# Patient Record
Sex: Female | Born: 1970 | Race: Black or African American | Hispanic: No | Marital: Single | Smoking: Current every day smoker
Health system: Southern US, Community
[De-identification: ages and names within clinical notes are randomized; demographics above are authoritative.]

## PROBLEM LIST (undated history)

## (undated) DIAGNOSIS — Z789 Other specified health status: Secondary | ICD-10-CM

## (undated) DIAGNOSIS — D649 Anemia, unspecified: Secondary | ICD-10-CM

## (undated) DIAGNOSIS — I10 Essential (primary) hypertension: Secondary | ICD-10-CM

## (undated) DIAGNOSIS — D219 Benign neoplasm of connective and other soft tissue, unspecified: Secondary | ICD-10-CM

## (undated) HISTORY — DX: Other specified health status: Z78.9

## (undated) HISTORY — PX: WISDOM TOOTH EXTRACTION: SHX21

## (undated) HISTORY — PX: MYOMECTOMY: SHX85

## (undated) HISTORY — DX: Benign neoplasm of connective and other soft tissue, unspecified: D21.9

## (undated) HISTORY — DX: Anemia, unspecified: D64.9

---

## 1999-07-02 ENCOUNTER — Emergency Department (HOSPITAL_COMMUNITY): Admission: EM | Admit: 1999-07-02 | Discharge: 1999-07-02 | Payer: Self-pay | Admitting: Emergency Medicine

## 2001-12-09 ENCOUNTER — Encounter: Admission: RE | Admit: 2001-12-09 | Discharge: 2001-12-09 | Payer: Self-pay | Admitting: Internal Medicine

## 2001-12-09 ENCOUNTER — Encounter: Payer: Self-pay | Admitting: Internal Medicine

## 2004-08-16 ENCOUNTER — Other Ambulatory Visit: Admission: RE | Admit: 2004-08-16 | Discharge: 2004-08-16 | Payer: Self-pay | Admitting: Obstetrics and Gynecology

## 2004-08-22 ENCOUNTER — Encounter: Admission: RE | Admit: 2004-08-22 | Discharge: 2004-08-22 | Payer: Self-pay | Admitting: Obstetrics & Gynecology

## 2004-09-27 ENCOUNTER — Ambulatory Visit (HOSPITAL_COMMUNITY): Admission: RE | Admit: 2004-09-27 | Discharge: 2004-09-27 | Payer: Self-pay | Admitting: Obstetrics & Gynecology

## 2009-03-03 HISTORY — PX: MYOMECTOMY: SHX85

## 2012-11-08 ENCOUNTER — Ambulatory Visit: Payer: Self-pay | Admitting: Internal Medicine

## 2012-11-08 VITALS — BP 130/80 | HR 74 | Temp 99.2°F | Resp 16 | Ht 68.0 in | Wt 161.0 lb

## 2012-11-08 DIAGNOSIS — N898 Other specified noninflammatory disorders of vagina: Secondary | ICD-10-CM

## 2012-11-08 DIAGNOSIS — Z2089 Contact with and (suspected) exposure to other communicable diseases: Secondary | ICD-10-CM

## 2012-11-08 DIAGNOSIS — Z202 Contact with and (suspected) exposure to infections with a predominantly sexual mode of transmission: Secondary | ICD-10-CM

## 2012-11-08 DIAGNOSIS — A599 Trichomoniasis, unspecified: Secondary | ICD-10-CM

## 2012-11-08 LAB — POCT WET PREP WITH KOH
KOH Prep POC: NEGATIVE
RBC Wet Prep HPF POC: NEGATIVE
Trichomonas, UA: POSITIVE
Yeast Wet Prep HPF POC: NEGATIVE

## 2012-11-08 MED ORDER — METRONIDAZOLE 500 MG PO TABS: ORAL_TABLET | ORAL | Status: DC

## 2012-11-08 NOTE — Progress Notes (Signed)
  Subjective:    Patient ID: Peggy Payne, female    DOB: 12-03-1970, 42 y.o.   MRN: [id number]  HPI 42 yo female presents with 2 week history of clear, odorous vaginal discharge. Minimal itching.  No burning, frequency, abdominal pain. Has occasional back pain associated with menses, relieved with Advil.  No fever.  Has 1 sex partner long term/not interested in STI screening at this time.   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 Does not want pap at this time due to cost. Has not had abnormal Pap in past.   Review of Systems     Objective:   Physical Exam Well developed female in NAD.  External genitalia normal, cervix with moderate amount thin, white discharge. No CMT, friability. Samples obtained with swabs.  Results for orders placed in visit on 11/08/12  POCT WET PREP WITH KOH      Result Value Range   Trichomonas, UA Positive     Clue Cells Wet Prep HPF POC 1-6     Epithelial Wet Prep HPF POC 0-3     Yeast Wet Prep HPF POC neg     Bacteria Wet Prep HPF POC 2+     RBC Wet Prep HPF POC neg     WBC Wet Prep HPF POC 2-8     KOH Prep POC Negative         Assessment & Plan:  Vaginal discharge - Plan: POCT Wet Prep with KOH  Trichimoniasis  Exposure to STD - Plan: GC/Chlamydia Probe Amp  Discussed diagnosis with patient and recommended testing for GC/chlymadia- patient agreed. Prescription provided for patient and her partner. Rec testing for partner.  Meds ordered this encounter  Medications  . metroNIDAZOLE (FLAGYL) 500 MG tablet    Sig: 4 tabs as single dose    Dispense:  8 tablet    Refill:  0   I have reviewed and agree with documentation. I participated fully in E/P. Robert P. Merla Riches, M.D.

## 2012-11-10 LAB — GC/CHLAMYDIA PROBE AMP
CT Probe RNA: NEGATIVE
GC Probe RNA: NEGATIVE

## 2012-12-20 ENCOUNTER — Ambulatory Visit: Payer: Self-pay | Admitting: Family Medicine

## 2012-12-20 VITALS — BP 132/84 | HR 89 | Temp 98.1°F | Resp 18 | Ht 66.0 in | Wt 164.0 lb

## 2012-12-20 DIAGNOSIS — N898 Other specified noninflammatory disorders of vagina: Secondary | ICD-10-CM

## 2012-12-20 DIAGNOSIS — A599 Trichomoniasis, unspecified: Secondary | ICD-10-CM

## 2012-12-20 LAB — POCT WET PREP WITH KOH
KOH Prep POC: NEGATIVE
Trichomonas, UA: POSITIVE
Yeast Wet Prep HPF POC: NEGATIVE

## 2012-12-20 MED ORDER — METRONIDAZOLE 500 MG PO TABS: 500.0000 mg | ORAL_TABLET | Freq: Three times a day (TID) | ORAL | Status: DC

## 2012-12-20 NOTE — Progress Notes (Signed)
  Subjective:  This chart was scribed for Elvina Sidle, MD by Quintella Reichert, ED scribe.  This patient was seen in room [facility] Room 14 and the patient's care was started at 4:18 PM.   Patient ID: Peggy Payne, female    DOB: [birth year], 42 y.o.   MRN: [id number]  Chief Complaint  Patient presents with  . 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 Her other STD tests were negative. She was treated with single dose of Flagyl as was her partner.  She never completely resolved her problem. She had some itching and took some yeast medicine but that really didn't make any difference either. He watery yellow-white discharge continues.   History reviewed. No pertinent past medical history.    Medication List       This list is accurate as of: 12/20/12  4:22 PM.  Always use your most recent med list.               metroNIDAZOLE 500 MG tablet  Commonly known as:  FLAGYL  4 tabs as single dose        No Known Allergies   Review of Systems     Objective:   Physical Exam  Nursing note and vitals reviewed.  Pelvic exam reveals a creamy white watery discharge. There is no bleeding or friability. Cervix is closed and nulliparous. There is no cervical motion tenderness. Results for orders placed in visit on 12/20/12  POCT WET PREP WITH KOH      Result Value Range   Trichomonas, UA Positive     Clue Cells Wet Prep HPF POC 0-4     Epithelial Wet Prep HPF POC 6-21     Yeast Wet Prep HPF POC neg     Bacteria Wet Prep HPF POC 3+     RBC Wet Prep HPF POC 0-3     WBC Wet Prep HPF POC 6-18     KOH Prep POC Negative       BP 132/84  Pulse 89  Temp(Src) 98.1 F (36.7 C)  Resp 18  Ht 5\' 6"  (1.676 m)  Wt 164 lb (74.39 kg)  BMI 26.48 kg/m2  SpO2 99%  LMP 12/16/2012    Assessment & Plan:   Vaginal discharge -  Plan: POCT Wet Prep with KOH, metroNIDAZOLE (FLAGYL) 500 MG tablet  Trichomonas - Plan: metroNIDAZOLE (FLAGYL) 500 MG tablet  Signed, Elvina Sidle, MD

## 2012-12-20 NOTE — Patient Instructions (Signed)
Trichomoniasis °Trichomoniasis is an infection, caused by the Trichomonas organism, that affects both women and men. In women, the outer female genitalia and the vagina are affected. In men, the penis is mainly affected, but the prostate and other reproductive organs can also be involved. Trichomoniasis is a sexually transmitted disease (STD) and is most often passed to another person through sexual contact. The majority of people who get trichomoniasis do so from a sexual encounter and are also at risk for other STDs. °CAUSES  °· Sexual intercourse with an infected partner. °· It can be present in swimming pools or hot tubs. °SYMPTOMS  °· Abnormal gray-green frothy vaginal discharge in women. °· Vaginal itching and irritation in women. °· Itching and irritation of the area outside the vagina in women. °· Penile discharge with or without pain in males. °· Inflammation of the urethra (urethritis), causing painful urination. °· Bleeding after sexual intercourse. °RELATED COMPLICATIONS °· Pelvic inflammatory disease. °· Infection of the uterus (endometritis). °· Infertility. °· Tubal (ectopic) pregnancy. °· It can be associated with other STDs, including gonorrhea and chlamydia, hepatitis B, and HIV. °COMPLICATIONS DURING PREGNANCY °· Early (premature) delivery. °· Premature rupture of the membranes (PROM). °· Low birth weight. °DIAGNOSIS  °· Visualization of Trichomonas under the microscope from the vagina discharge. °· Ph of the vagina greater than 4.5, tested with a test tape. °· Trich Rapid Test. °· Culture of the organism, but this is not usually needed. °· It may be found on a Pap test. °· Having a "strawberry cervix,"which means the cervix looks very red like a strawberry. °TREATMENT  °· You may be given medication to fight the infection. Inform your caregiver if you could be or are pregnant. Some medications used to treat the infection should not be taken during pregnancy. °· Over-the-counter medications or  creams to decrease itching or irritation may be recommended. °· Your sexual partner will need to be treated if infected. °HOME CARE INSTRUCTIONS  °· Take all medication prescribed by your caregiver. °· Take over-the-counter medication for itching or irritation as directed by your caregiver. °· Do not have sexual intercourse while you have the infection. °· Do not douche or wear tampons. °· Discuss your infection with your partner, as your partner may have acquired the infection from you. Or, your partner may have been the person who transmitted the infection to you. °· Have your sex partner examined and treated if necessary. °· Practice safe, informed, and protected sex. °· See your caregiver for other STD testing. °SEEK MEDICAL CARE IF:  °· You still have symptoms after you finish the medication. °· You have an oral temperature above 102° F (38.9° C). °· You develop belly (abdominal) pain. °· You have pain when you urinate. °· You have bleeding after sexual intercourse. °· You develop a rash. °· The medication makes you sick or makes you throw up (vomit). °Document Released: 08/13/2000 Document Revised: 05/12/2011 Document Reviewed: 09/08/2008 °ExitCare® Patient Information ©2014 [employer]. ° °

## 2020-08-22 ENCOUNTER — Encounter (HOSPITAL_COMMUNITY): Payer: Self-pay | Admitting: *Deleted

## 2020-08-22 ENCOUNTER — Other Ambulatory Visit: Payer: Self-pay

## 2020-08-22 ENCOUNTER — Emergency Department (HOSPITAL_COMMUNITY): Payer: 59

## 2020-08-22 ENCOUNTER — Emergency Department (HOSPITAL_COMMUNITY)
Admission: EM | Admit: 2020-08-22 | Discharge: 2020-08-23 | Disposition: A | Discharge status: Home / Self Care | Payer: 59 | Attending: Emergency Medicine | Admitting: Emergency Medicine

## 2020-08-22 DIAGNOSIS — D251 Intramural leiomyoma of uterus: Secondary | ICD-10-CM | POA: Insufficient documentation

## 2020-08-22 DIAGNOSIS — F17200 Nicotine dependence, unspecified, uncomplicated: Secondary | ICD-10-CM | POA: Insufficient documentation

## 2020-08-22 DIAGNOSIS — R109 Unspecified abdominal pain: Secondary | ICD-10-CM | POA: Diagnosis present

## 2020-08-22 DIAGNOSIS — D649 Anemia, unspecified: Secondary | ICD-10-CM | POA: Insufficient documentation

## 2020-08-22 DIAGNOSIS — N838 Other noninflammatory disorders of ovary, fallopian tube and broad ligament: Secondary | ICD-10-CM | POA: Insufficient documentation

## 2020-08-22 LAB — URINALYSIS, ROUTINE W REFLEX MICROSCOPIC
Bilirubin Urine: NEGATIVE
Glucose, UA: NEGATIVE mg/dL
Hgb urine dipstick: NEGATIVE
Ketones, ur: NEGATIVE mg/dL
Leukocytes,Ua: NEGATIVE
Nitrite: NEGATIVE
Protein, ur: NEGATIVE mg/dL
Specific Gravity, Urine: 1.006 (ref 1.005–1.030)
pH: 5 (ref 5.0–8.0)

## 2020-08-22 LAB — COMPREHENSIVE METABOLIC PANEL
ALT: 23 U/L (ref 0–44)
AST: 30 U/L (ref 15–41)
Albumin: 5 g/dL (ref 3.5–5.0)
Alkaline Phosphatase: 59 U/L (ref 38–126)
Anion gap: 6 (ref 5–15)
BUN: 15 mg/dL (ref 6–20)
CO2: 26 mmol/L (ref 22–32)
Calcium: 9.6 mg/dL (ref 8.9–10.3)
Chloride: 104 mmol/L (ref 98–111)
Creatinine, Ser: 0.67 mg/dL (ref 0.44–1.00)
GFR, Estimated: >60 mL/min (ref >60)
Glucose, Bld: 97 mg/dL (ref 70–99)
Potassium: 3.7 mmol/L (ref 3.5–5.1)
Sodium: 136 mmol/L (ref 135–145)
Total Bilirubin: 0.4 mg/dL (ref 0.3–1.2)
Total Protein: 8.5 g/dL — ABNORMAL HIGH (ref 6.5–8.1)

## 2020-08-22 LAB — CBC WITH DIFFERENTIAL/PLATELET
Abs Immature Granulocytes: 0.01 10*3/uL (ref 0.00–0.07)
Basophils Absolute: 0 10*3/uL (ref 0.0–0.1)
Basophils Relative: 1 %
Eosinophils Absolute: 0 10*3/uL (ref 0.0–0.5)
Eosinophils Relative: 0 %
HCT: 30.6 % — ABNORMAL LOW (ref 36.0–46.0)
Hemoglobin: 8.6 g/dL — ABNORMAL LOW (ref 12.0–15.0)
Immature Granulocytes: 0 %
Lymphocytes Relative: 27 %
Lymphs Abs: 1.5 10*3/uL (ref 0.7–4.0)
MCH: 18.6 pg — ABNORMAL LOW (ref 26.0–34.0)
MCHC: 28.1 g/dL — ABNORMAL LOW (ref 30.0–36.0)
MCV: 66.1 fL — ABNORMAL LOW (ref 80.0–100.0)
Monocytes Absolute: 0.8 10*3/uL (ref 0.1–1.0)
Monocytes Relative: 14 %
Neutro Abs: 3.4 10*3/uL (ref 1.7–7.7)
Neutrophils Relative %: 58 %
Platelets: 589 10*3/uL — ABNORMAL HIGH (ref 150–400)
RBC: 4.63 MIL/uL (ref 3.87–5.11)
RDW: 20.4 % — ABNORMAL HIGH (ref 11.5–15.5)
WBC: 5.8 10*3/uL (ref 4.0–10.5)
nRBC: 0 % (ref 0.0–0.2)

## 2020-08-22 LAB — LIPASE, BLOOD: Lipase: 25 U/L (ref 11–51)

## 2020-08-22 MED ORDER — IOHEXOL 300 MG/ML  SOLN
100.0000 mL | Freq: Once | INTRAMUSCULAR | Status: AC | PRN
  Administered 2020-08-22: 100 mL via INTRAVENOUS

## 2020-08-22 MED ORDER — SODIUM CHLORIDE (PF) 0.9 % IJ SOLN
INTRAMUSCULAR | Status: AC
  Filled 2020-08-22: qty 50

## 2020-08-22 MED ORDER — KETOROLAC TROMETHAMINE 30 MG/ML IJ SOLN
30.0000 mg | Freq: Once | INTRAMUSCULAR | Status: AC
  Administered 2020-08-22: 30 mg via INTRAVENOUS
  Filled 2020-08-22: qty 1

## 2020-08-22 NOTE — ED Triage Notes (Signed)
Sent from UC. Pain in right side of abdomen. Hx of uterine fibroids. Reports some discomfort with urination.

## 2020-08-22 NOTE — ED Provider Notes (Signed)
Emergency Medicine Provider Triage Evaluation Note  Peggy Payne 50 y.o. female was evaluated in triage.  Pt complains of abdominal pain.  She reports has been intermittently is primarily in the right side and suprapubic region.  She reports that she went to urgent care walk-in clinic this morning and was noted to have a palpable abdominal mass was sent to the ED for further evaluation.  She reports she has had some discomfort with urination.  No hematuria.  She is also had some nausea.  Denies any fevers, vomiting.  She reports history of uterine fibroids and has had myomectomy about 15 years ago.  She reports she has not had recent follow-up regarding her uterine fibroids.   Review of Systems  Positive: Abdominal pain, nausea, dysuria Negative: Fevers, vomiting.  Physical Exam  BP 134/82   Pulse 70   Temp 98.2 F (36.8 C) (Oral)   Resp 18   Ht 5\' 4"  (1.626 m)   Wt 65.8 kg   SpO2 100%   BMI 24.89 kg/m  Gen:   Awake, no distress   HEENT:  Atraumatic  Resp:  Normal effort  Cardiac:  Normal rate  Abd:   Nondistended, tenderness noted to suprapubic region, right lower quadrant. MSK:   Moves extremities without difficulty  Neuro:  Speech clear   Other:      Medical Decision Making  Medically screening exam initiated at 4:07 PM  Appropriate orders placed.  Peggy Payne was informed that the remainder of the evaluation will be completed by another provider, this initial triage assessment does not replace that evaluation. They are counseled that they will need to remain in the ED until the completion of their workup, including full H&P and results of any tests.  Risks of leaving the emergency department prior to completion of treatment were discussed. Patient was advised to inform ED staff if they are leaving before their treatment is complete. The patient acknowledged these risks and time was allowed for questions.     The patient appears stable so that the remainder of the MSE may be  completed by another provider.    Clinical Impression  Abdominal pain.   Portions of this note were generated with Lobbyist. Dictation errors may occur despite best attempts at proofreading.     Volanda Napoleon, PA-C 08/22/20 1608    Truddie Hidden, MD 08/22/20 [phone]

## 2020-08-22 NOTE — Discharge Instructions (Addendum)
The gynecology clinic should be contacting you in the next couple of days to schedule close follow-up appointment. Is important you follow-up regarding your abnormal imaging findings today.  They can also keep a close eye on your hemoglobin level. Return to the emergency department for heavier than normal vaginal bleeding with associated lightheadedness/severe fatigue or shortness of breath, or any concerning symptoms.

## 2020-08-22 NOTE — ED Provider Notes (Signed)
[facility] DEPT Provider Note   CSN: [id number] Arrival date & time: 08/22/20  1554     History Chief Complaint  Patient presents with   Abdominal Pain    Peggy Payne is a 50 y.o. female past medical history of uterine fibroids, presenting emergency department for evaluation of 1 week of intermittent abdominal pain.  Pain is mostly in the lower abdomen and the right flank.  Pain is worse with movement and walking.  She has some "irritation" with urination though no dysuria.  Maybe has some urgency.  LMP was about 2 weeks ago.  Went to urgent care today who sent her for evaluation of abdominal mass.  She denies hematuria, fevers, vomiting, diarrhea or constipation.  She is having some nausea intermittently.  Treated with over-the-counter medications.  She reports history of myomectomy about 15 years ago from uterine fibroids.  Is not actively followed by GYN.   The history is provided by the patient.      History reviewed. No pertinent past medical history.  There are no problems to display for this patient.   Past Surgical History:  Procedure Laterality Date   MYOMECTOMY     MYOMECTOMY  2011     OB History   No obstetric history on file.     Family History  Problem Relation Age of Onset   Hypertension Mother    Cancer Father     Social History   Tobacco Use   Smoking status: Light Smoker    Pack years: 0.00    Home Medications Prior to Admission medications   Medication Sig Start Date End Date Taking? Authorizing Provider  ibuprofen (ADVIL) 200 MG tablet Take 800 mg by mouth every 6 (six) hours as needed for mild pain.   Yes [provider]  metroNIDAZOLE (FLAGYL) 500 MG tablet Take 1 tablet (500 mg total) by mouth 3 (three) times daily. Patient not taking: No sig reported 12/20/12   Robyn Haber, MD    Allergies    Patient has no known allergies.  Review of Systems   Review of Systems  All other systems  reviewed and are negative.  Physical Exam Updated Vital Signs BP 138/83 (BP Location: Right Arm)   Pulse 72   Temp 98.4 F (36.9 C) (Oral)   Resp 18   SpO2 100%   Physical Exam Vitals and nursing note reviewed.  Constitutional:      General: She is not in acute distress.    Appearance: She is well-developed.  HENT:     Head: Normocephalic and atraumatic.  Eyes:     Conjunctiva/sclera: Conjunctivae normal.  Cardiovascular:     Rate and Rhythm: Normal rate and regular rhythm.  Pulmonary:     Effort: Pulmonary effort is normal.     Breath sounds: Normal breath sounds.  Abdominal:     Palpations: Abdomen is soft. There is mass (Firm mass in the lower abdomen up to the umbilicus).     Tenderness: There is abdominal tenderness in the right lower quadrant and suprapubic area. There is no guarding or rebound.  Skin:    General: Skin is warm.  Neurological:     Mental Status: She is alert.  Psychiatric:        Behavior: Behavior normal.    ED Results / Procedures / Treatments   Labs (all labs ordered are listed, but only abnormal results are displayed) Labs Reviewed  COMPREHENSIVE METABOLIC PANEL - Abnormal; Notable for the following components:  Result Value   Total Protein 8.5 (*)    All other components within normal limits  CBC WITH DIFFERENTIAL/PLATELET - Abnormal; Notable for the following components:   Hemoglobin 8.6 (*)    HCT 30.6 (*)    MCV 66.1 (*)    MCH 18.6 (*)    MCHC 28.1 (*)    RDW 20.4 (*)    Platelets 589 (*)    All other components within normal limits  URINALYSIS, ROUTINE W REFLEX MICROSCOPIC - Abnormal; Notable for the following components:   Color, Urine STRAW (*)    All other components within normal limits  LIPASE, BLOOD    EKG None  Radiology US Pelvis Complete  Result Date: 08/22/2020 CLINICAL DATA:  Abdominal pain and distension. EXAM: TRANSABDOMINAL ULTRASOUND OF PELVIS TECHNIQUE: Transabdominal ultrasound examination of the  pelvis was performed including evaluation of the uterus, ovaries, adnexal regions, and pelvic cul-de-sac. Patient declined transvaginal exam. COMPARISON:  None. FINDINGS: Uterus Measurements: 19.0 x 12.4 x 16.9 cm = volume: 2,086 mL. The uterus is enlarged, heterogeneous, with innumerable fibroids. The largest fibroid is seen in the fundus at the fundus measuring 11.2 x 7.8 x 8.8 cm. Technologist states the uterus extends beyond the umbilicus. Endometrium Obscured by fibroids, where visualized is normal measuring 3 mm. Right ovary Measurements: 7.4 x 4.3 x 6.6 cm = volume: 109 mL. There are 2 cysts in the ovary. Larger cyst measuring 6.4 x 3.4 x 4.9 cm, with question of slight thickening peripherally. Smaller cyst measures 3.4 x 2.7 x 2.6 cm. Blood flow is noted to the ovarian parenchyma. Left ovary Measurements: Majority of the ovary is replaced by a large cyst. Cyst measures 8.4 x 5.5 x 6.3 cm and has a peripheral echogenic focus with shadowing, possible calcifications. There is blood flow to the ovarian parenchyma peripherally. Other findings:  No abnormal free fluid. IMPRESSION: 1. Enlarged heterogeneous uterus with multiple uterine fibroids, largest fibroid at the fundus measures 11 cm. 2. Bilateral ovarian cysts. 8.4 cm cyst in the left ovary has a peripheral echogenic focus with shadowing, possible calcification. This may represent an a dermoid. A 6.4 cm cyst in the right ovary has questionable thickening peripherally. These do not meet criteria for simple cysts. Recommend gyn consultation and consideration for MRI characterization. Electronically Signed   By: Keith Rake M.D.   On: 08/22/2020 17:51   CT Abdomen Pelvis W Contrast  Result Date: 08/22/2020 CLINICAL DATA:  Right lower quadrant abdominal pain. Appendicitis suspected. Known uterine fibroids. Right ovarian cyst 6.4 cm noted on ultrasound. EXAM: CT ABDOMEN AND PELVIS WITH CONTRAST TECHNIQUE: Multidetector CT imaging of the abdomen and  pelvis was performed using the standard protocol following bolus administration of intravenous contrast. CONTRAST:  164mL OMNIPAQUE IOHEXOL 300 MG/ML  SOLN COMPARISON:  Ultrasound pelvis 08/22/2020 FINDINGS: Lower chest: No acute abnormality. Hepatobiliary: No focal liver abnormality. No gallstones, gallbladder wall thickening, or pericholecystic fluid. No biliary dilatation. Pancreas: No focal lesion. Normal pancreatic contour. No surrounding inflammatory changes. No main pancreatic ductal dilatation. Spleen: Normal in size without focal abnormality. Adrenals/Urinary Tract: No adrenal nodule bilaterally. Bilateral kidneys enhance symmetrically. No hydronephrosis. No hydroureter. The urinary bladder is unremarkable. Stomach/Bowel: Stomach is within normal limits. No evidence of bowel wall thickening or dilatation. Appendix appears normal. Vascular/Lymphatic: No abdominal aorta or iliac aneurysm. Mild atherosclerotic plaque of the aorta and its branches. No abdominal, pelvic, or inguinal lymphadenopathy. Reproductive: Markedly enlarged and heterogeneous uterus measuring up to 18 cm with multiple intramural uterine fibroids with  the largest measuring up to at least 9 cm. Couple of simple appearing right adnexal cystic lesions measuring 2.6cm and up to 6.8cm that are better evaluated on ultrasound pelvis 08/22/2020. Left ovarian 9.2 cystic lesion with associated coarse calcification (2:49). Other: No intraperitoneal free fluid. No intraperitoneal free gas. No organized fluid collection. Musculoskeletal: No abdominal wall hernia or abnormality. No suspicious lytic or blastic osseous lesions. No acute displaced fracture. IMPRESSION: 1. Normal appendix. 2. Left ovarian 9.2 cystic lesion with associated coarse calcification. Finding better appreciated on ultrasound pelvis 08/22/2020. Recommend gynecologic consultation as well as MRI pelvis for further evaluation (as per pelvic ultrasound). **Please note that given the size  of the uterus and high-riding left ovarian lesion, the MRI should include part of the abdomen to visualize all of the structures fully. 3. Couple of simple appearing right adnexal cystic lesions measuring 2.6cm and up to 6.8cm that are better evaluated on ultrasound pelvis 08/22/2020. 4. Markedly enlarged and heterogeneous uterus measuring up to 18 cm with multiple intramural uterine fibroids with the largest measuring up to at least 9 cm. Recommend correlation with prior cross-sectional imaging or ultrasound to evaluate size stability. 5.  Aortic Atherosclerosis (ICD10-I70.0). Electronically Signed   By: Iven Finn M.D.   On: 08/22/2020 23:17    Procedures Procedures   Medications Ordered in ED Medications  sodium chloride (PF) 0.9 % injection (has no administration in time range)  ketorolac (TORADOL) 30 MG/ML injection 30 mg (30 mg Intravenous Given 08/22/20 2212)  iohexol (OMNIPAQUE) 300 MG/ML solution 100 mL (100 mLs Intravenous Contrast Given 08/22/20 2246)    ED Course  I have reviewed the triage vital signs and the nursing notes.  Pertinent labs & imaging results that were available during my care of the patient were reviewed by me and considered in my medical decision making (see chart for details).  Clinical Course as of 08/23/20 0033  Wed Aug 22, 2020  2118 Consulted with Dr. Kennon Rounds on-call for GYN.  She reviewed ultrasound imaging and patient's work-up today.  Agrees with plan for CT scan to rule out any other acute pathology.  Otherwise GYN clinic will contact patient in the next few days for follow-up appointment.  Appreciate consultation. [JR]    Clinical Course User Index [JR] Nikiyah Fackler, [place] N, PA-C   MDM Rules/Calculators/A&P                          Patient is a 50 year old female with history of uterine fibroids and myomectomy, presenting from urgent care for evaluation of abdominal mass with lower abdominal discomfort, intermittent over the last week.  No fevers or  chills, no vomiting or diarrhea.  Abdomen shows large firm lower abdominal mass without guarding or rebound.  She does have some discomfort.  Pelvic ultrasound was obtained in triage and shows uterine fibroids and ovarian cystic lesions.  She has no elevated white count.  She is anemic at 8.6 though this appears to be chronic in nature with further discussion.  She is having no symptoms of symptomatic anemia.  Her UA is negative.  Metabolic panel shows mildly elevated protein of 8.5, otherwise is within normal limits.  Lipids within the limits.  Considering patient's presentation and ultrasound findings, consulted with GYN on-call Dr. Kennon Rounds.  She reviewed blood work and imaging today.  Discussed possible CT imaging today to rule out any other acute intra-abdominal pathology that could be causing patient's pain today, she is in agreement with  plan for CT scan.  CT shows no acute intra-abdominal pathology, shows similar findings to ultrasound.  She had adequate improvement with Toradol here.  Recommend she continue over-the-counter treatment at home, return precautions regarding anemia and any heavier vaginal bleeding.  GYN office will contact patient in the next couple of days to schedule close follow-up.  She will need hemoglobin recheck and MRI imaging for further assessment.  She is discharged in no distress.  Discussed results, findings, treatment and follow up. Patient advised of return precautions. Patient verbalized understanding and agreed with plan.  Final Clinical Impression(s) / ED Diagnoses Final diagnoses:  Intramural leiomyoma of uterus  Ovarian mass  Low hemoglobin    Rx / DC Orders ED Discharge Orders     None        Sya Nestler, [place] N, PA-C 08/23/20 0033    Varney Biles, MD 08/24/20 [phone]

## 2020-08-23 NOTE — ED Notes (Signed)
Pt verbalized understanding of d/c and follow up care. Ambulatory with steady gait.  

## 2020-09-20 ENCOUNTER — Ambulatory Visit (INDEPENDENT_AMBULATORY_CARE_PROVIDER_SITE_OTHER): Payer: 59 | Admitting: Obstetrics and Gynecology

## 2020-09-20 ENCOUNTER — Other Ambulatory Visit: Payer: Self-pay

## 2020-09-20 ENCOUNTER — Other Ambulatory Visit (HOSPITAL_COMMUNITY)
Admission: RE | Admit: 2020-09-20 | Discharge: 2020-09-20 | Disposition: A | Discharge status: Home / Self Care | Payer: 59 | Source: Ambulatory Visit | Attending: Obstetrics and Gynecology | Admitting: Obstetrics and Gynecology

## 2020-09-20 ENCOUNTER — Encounter: Payer: Self-pay | Admitting: Obstetrics and Gynecology

## 2020-09-20 VITALS — BP 159/81 | HR 88 | Ht 66.0 in | Wt 174.5 lb

## 2020-09-20 DIAGNOSIS — Z124 Encounter for screening for malignant neoplasm of cervix: Secondary | ICD-10-CM | POA: Diagnosis present

## 2020-09-20 DIAGNOSIS — I10 Essential (primary) hypertension: Secondary | ICD-10-CM

## 2020-09-20 DIAGNOSIS — N83201 Unspecified ovarian cyst, right side: Secondary | ICD-10-CM

## 2020-09-20 DIAGNOSIS — Z1231 Encounter for screening mammogram for malignant neoplasm of breast: Secondary | ICD-10-CM | POA: Diagnosis not present

## 2020-09-20 MED ORDER — FERROUS SULFATE 325 (65 FE) MG PO TABS: 325.0000 mg | ORAL_TABLET | Freq: Two times a day (BID) | ORAL | 1 refills | Status: DC

## 2020-09-20 NOTE — Addendum Note (Signed)
Addended by: Mora Bellman on: 09/20/2020 04:34 PM   Modules accepted: Orders

## 2020-09-20 NOTE — Progress Notes (Signed)
50 yo P0 referred from ED for management of pelvic pain. Patient found to have a fibroid uterus and a 9 cm ovarian cyst. Patient reports a monthly 7 day period. Patient reports a history of previous myomectomy and knew her fibroids had returned. Patient reports the presence of pain for several months and presented to the ED in June as it worsened. She admits to vasomotor symptoms. She denies any other symptoms. She denies urinary complaints. Patient is requesting a hysterectomy  Past Medical History:  Diagnosis Date   Anemia    Fibroid    Medical history non-contributory    Past Surgical History:  Procedure Laterality Date   MYOMECTOMY     MYOMECTOMY  2011   Family History  Problem Relation Age of Onset   Hypertension Mother    Cancer Father    Social History   Tobacco Use   Smoking status: Every Day    Types: Cigarettes   Smokeless tobacco: Never  Vaping Use   Vaping Use: Never used  Substance Use Topics   Alcohol use: Yes    Comment: occasionally   Drug use: Never   ROS Reviewed of systems as above. All other systems non contributory  Blood pressure (!) 159/81, pulse 88, height 5\' 6"  (1.676 m), weight 174 lb 8 oz (79.2 kg), last menstrual period 09/11/2020. GENERAL: Well-developed, well-nourished female in no acute distress.  HEENT: Normocephalic, atraumatic. Sclerae anicteric.  NECK: Supple. Normal thyroid.  LUNGS: Clear to auscultation bilaterally.  HEART: Regular rate and rhythm. BREASTS: Symmetric in size. No palpable masses or lymphadenopathy, skin changes, or nipple drainage. ABDOMEN: Soft, nontender, nondistended. No organomegaly. PELVIC: Normal external female genitalia. Vagina is pink and rugated.  Normal discharge. Normal appearing cervix. Uterus is normal in size.  No adnexal mass or tenderness. EXTREMITIES: No cyanosis, clubbing, or edema, 2+ distal pulses.   US Pelvis Complete  Result Date: 08/22/2020 CLINICAL DATA:  Abdominal pain and distension. EXAM:  TRANSABDOMINAL ULTRASOUND OF PELVIS TECHNIQUE: Transabdominal ultrasound examination of the pelvis was performed including evaluation of the uterus, ovaries, adnexal regions, and pelvic cul-de-sac. Patient declined transvaginal exam. COMPARISON:  None. FINDINGS: Uterus Measurements: 19.0 x 12.4 x 16.9 cm = volume: 2,086 mL. The uterus is enlarged, heterogeneous, with innumerable fibroids. The largest fibroid is seen in the fundus at the fundus measuring 11.2 x 7.8 x 8.8 cm. Technologist states the uterus extends beyond the umbilicus. Endometrium Obscured by fibroids, where visualized is normal measuring 3 mm. Right ovary Measurements: 7.4 x 4.3 x 6.6 cm = volume: 109 mL. There are 2 cysts in the ovary. Larger cyst measuring 6.4 x 3.4 x 4.9 cm, with question of slight thickening peripherally. Smaller cyst measures 3.4 x 2.7 x 2.6 cm. Blood flow is noted to the ovarian parenchyma. Left ovary Measurements: Majority of the ovary is replaced by a large cyst. Cyst measures 8.4 x 5.5 x 6.3 cm and has a peripheral echogenic focus with shadowing, possible calcifications. There is blood flow to the ovarian parenchyma peripherally. Other findings:  No abnormal free fluid. IMPRESSION: 1. Enlarged heterogeneous uterus with multiple uterine fibroids, largest fibroid at the fundus measures 11 cm. 2. Bilateral ovarian cysts. 8.4 cm cyst in the left ovary has a peripheral echogenic focus with shadowing, possible calcification. This may represent an a dermoid. A 6.4 cm cyst in the right ovary has questionable thickening peripherally. These do not meet criteria for simple cysts. Recommend gyn consultation and consideration for MRI characterization. Electronically Signed   By: Threasa Beards  Sanford M.D.   On: 08/22/2020 17:51   CT Abdomen Pelvis W Contrast  Result Date: 08/22/2020 CLINICAL DATA:  Right lower quadrant abdominal pain. Appendicitis suspected. Known uterine fibroids. Right ovarian cyst 6.4 cm noted on ultrasound. EXAM: CT  ABDOMEN AND PELVIS WITH CONTRAST TECHNIQUE: Multidetector CT imaging of the abdomen and pelvis was performed using the standard protocol following bolus administration of intravenous contrast. CONTRAST:  136mL OMNIPAQUE IOHEXOL 300 MG/ML  SOLN COMPARISON:  Ultrasound pelvis 08/22/2020 FINDINGS: Lower chest: No acute abnormality. Hepatobiliary: No focal liver abnormality. No gallstones, gallbladder wall thickening, or pericholecystic fluid. No biliary dilatation. Pancreas: No focal lesion. Normal pancreatic contour. No surrounding inflammatory changes. No main pancreatic ductal dilatation. Spleen: Normal in size without focal abnormality. Adrenals/Urinary Tract: No adrenal nodule bilaterally. Bilateral kidneys enhance symmetrically. No hydronephrosis. No hydroureter. The urinary bladder is unremarkable. Stomach/Bowel: Stomach is within normal limits. No evidence of bowel wall thickening or dilatation. Appendix appears normal. Vascular/Lymphatic: No abdominal aorta or iliac aneurysm. Mild atherosclerotic plaque of the aorta and its branches. No abdominal, pelvic, or inguinal lymphadenopathy. Reproductive: Markedly enlarged and heterogeneous uterus measuring up to 18 cm with multiple intramural uterine fibroids with the largest measuring up to at least 9 cm. Couple of simple appearing right adnexal cystic lesions measuring 2.6cm and up to 6.8cm that are better evaluated on ultrasound pelvis 08/22/2020. Left ovarian 9.2 cystic lesion with associated coarse calcification (2:49). Other: No intraperitoneal free fluid. No intraperitoneal free gas. No organized fluid collection. Musculoskeletal: No abdominal wall hernia or abnormality. No suspicious lytic or blastic osseous lesions. No acute displaced fracture. IMPRESSION: 1. Normal appendix. 2. Left ovarian 9.2 cystic lesion with associated coarse calcification. Finding better appreciated on ultrasound pelvis 08/22/2020. Recommend gynecologic consultation as well as MRI  pelvis for further evaluation (as per pelvic ultrasound). **Please note that given the size of the uterus and high-riding left ovarian lesion, the MRI should include part of the abdomen to visualize all of the structures fully. 3. Couple of simple appearing right adnexal cystic lesions measuring 2.6cm and up to 6.8cm that are better evaluated on ultrasound pelvis 08/22/2020. 4. Markedly enlarged and heterogeneous uterus measuring up to 18 cm with multiple intramural uterine fibroids with the largest measuring up to at least 9 cm. Recommend correlation with prior cross-sectional imaging or ultrasound to evaluate size stability. 5.  Aortic Atherosclerosis (ICD10-I70.0). Electronically Signed   By: Iven Finn M.D.   On: 08/22/2020 23:17     A/P 50 yo with fibroid uterus and left ovarian cyst Pap smear collected Screening mammogram ordered CA-125 ordered Discussed hysterectomy with left oophorectomy. Risks, benefits and alternatives were explained including but not limited to risks of bleeding, infection and damage to adjacent organs. Patient verbalized understanding and all questions were answered.  Discussed TAH vs RAVH. Patient understands that for [facility] she will need to meet with Dr. Ihor Dow for consultation. Patient is interested in [facility] as she does not want to take too much time away from work

## 2020-09-21 ENCOUNTER — Ambulatory Visit
Admission: RE | Admit: 2020-09-21 | Discharge: 2020-09-21 | Disposition: A | Discharge status: Home / Self Care | Payer: 59 | Source: Ambulatory Visit | Attending: Obstetrics and Gynecology | Admitting: Obstetrics and Gynecology

## 2020-09-21 DIAGNOSIS — Z1231 Encounter for screening mammogram for malignant neoplasm of breast: Secondary | ICD-10-CM

## 2020-09-21 LAB — CA 125: Cancer Antigen (CA) 125: 8.1 U/mL (ref 0.0–38.1)

## 2020-09-24 ENCOUNTER — Telehealth: Payer: Self-pay | Admitting: General Practice

## 2020-09-24 LAB — CYTOLOGY - PAP
Comment: NEGATIVE
Diagnosis: NEGATIVE
High risk HPV: NEGATIVE

## 2020-09-24 NOTE — Telephone Encounter (Signed)
-----   Message from Mora Bellman, MD sent at 09/20/2020  4:31 PM EDT ----- Please schedule patient with Dr. Ihor Dow for robotic assisted vaginal hysterectomy consultation  Thanks  Peggy

## 2020-09-24 NOTE — Telephone Encounter (Signed)
Patient called and notified of consultation for surgery with Dr. Ihor Dow on 11/12/2020 at 11:15am.  Pt verbalized understanding.

## 2020-11-12 ENCOUNTER — Other Ambulatory Visit: Payer: Self-pay

## 2020-11-12 ENCOUNTER — Encounter: Payer: Self-pay | Admitting: Obstetrics & Gynecology

## 2020-11-12 ENCOUNTER — Ambulatory Visit: Payer: 59 | Admitting: Obstetrics & Gynecology

## 2020-11-12 VITALS — BP 153/75 | HR 88 | Ht 66.0 in | Wt 176.0 lb

## 2020-11-12 DIAGNOSIS — D250 Submucous leiomyoma of uterus: Secondary | ICD-10-CM

## 2020-11-12 DIAGNOSIS — N83202 Unspecified ovarian cyst, left side: Secondary | ICD-10-CM | POA: Diagnosis not present

## 2020-11-12 DIAGNOSIS — R102 Pelvic and perineal pain: Secondary | ICD-10-CM

## 2020-11-12 DIAGNOSIS — N83201 Unspecified ovarian cyst, right side: Secondary | ICD-10-CM

## 2020-11-12 NOTE — Patient Instructions (Signed)
Abdominal Hysterectomy Abdominal hysterectomy is a surgical procedure to remove the uterus. The uterus is an organ that holds the baby as it develops before birth. This surgery may be done if a woman has certain problems of the uterus. These may include cancer or growths (tumors or fibroids). Other problems include infection, chronic pain, severe bleeding, or other problems of the menstrual cycle. The procedure may also be done if: The uterus has slipped down into the vagina (uterine prolapse). The tissue that lines the uterus is growing outside of its normal location (endometriosis). Depending on the reason for this procedure, other reproductive organs may also be removed. These could include: The lowest part of the uterus (cervix), which opens into the vagina. The organs that make eggs (ovaries). The tubes that connect the ovaries to the uterus (fallopian tubes). Tell your health care provider about: Any allergies you have. All medicines you are taking, including vitamins, herbs, eye drops, creams, and over-the-counter medicines. Any problems you or family members have had with anesthetic medicines. Any blood disorders you have. Any surgeries you have had. Any medical conditions you have or have had. Whether you are pregnant or may be pregnant. What are the risks? Generally, this is a safe procedure. However, problems may occur, including: Bleeding. Infection. Allergic reactions to medicines or dyes. Damage to nearby structures or organs. Nerve injury. Decreased interest in sex or pain during sex. Blood clots that can break free and travel to your lungs. What happens before the procedure? Staying hydrated Follow instructions from your health care provider about hydration, which may include: Up to 2 hours before the procedure - you may continue to drink clear liquids, such as water, clear fruit juice, black coffee, and plain tea. Eating and drinking restrictions Follow instructions  from your health care provider about eating and drinking, which may include: 8 hours before the procedure - stop eating heavy meals or foods, such as meat, fried foods, or fatty foods. 6 hours before the procedure - stop eating light meals or foods, such as toast or cereal. 6 hours before the procedure - stop drinking milk or drinks that contain milk. 2 hours before the procedure - stop drinking clear liquids. Medicines Ask your health care provider about: Changing or stopping your regular medicines. This is especially important if you are taking diabetes medicines or blood thinners. Taking medicines such as aspirin and ibuprofen. These medicines can thin your blood. Do not take these medicines unless your health care provider tells you to take them. Taking over-the-counter medicines, vitamins, herbs, and supplements. You may be asked to take a medicine to empty your colon (bowel preparation). General instructions This procedure can affect the way you feel about yourself. Talk with your health care provider about the physical and emotional changes this procedure may cause. Do not use any products that contain nicotine or tobacco for at least 4 weeks before the procedure. These products include cigarettes, chewing tobacco, and vaping devices, such as e-cigarettes. If you need help quitting, ask your health care provider. Do not drink beverages that contain alcohol prior to the procedure. Alcohol can increase your risk of bleeding and complications. If you need help stopping, ask your health care provider. Plan to have a responsible adult take you home from the hospital or clinic. Ask your health care provider: How your surgery site will be marked. What steps will be taken to help prevent infection. These steps may include: Removing hair at the surgery site. Washing skin with a germ-killing  soap. Taking antibiotic medicine. What happens during the procedure? An IV will be inserted into one of  your veins. You will be given one or more of the following: A medicine to help you relax (sedative). A medicine to make you fall asleep (general anesthetic). A medicine that is injected into your spine to numb the area below and slightly above the injection site (spinal anesthetic). A medicine that is injected into an area of your body to numb everything below the injection site (regional anesthetic). Compression stockings will be placed on your legs to promote circulation. A thin, flexible tube (catheter) will be inserted to help drain your urine. An incision will be made through the skin in your lower abdomen. The incision may go side to side or up and down. Your uterus and any other organs that need to be removed will be carefully taken out. Bleeding will be controlled with clamps or stitches (sutures). Your incision will be closed with sutures, skin glue, or adhesive strips. A bandage (dressing) will be placed over the incision. The procedure may vary among health care providers and hospitals. What happens after the procedure? Your blood pressure, heart rate, breathing rate, and blood oxygen level will be monitored until you leave the hospital or clinic. You will be given pain medicine as needed. Ask your health care provider how long you will need to stay in the hospital after your procedure. You may have a liquid diet at first. You will most likely return to your usual diet the day after surgery. You will still have the urinary catheter in place. It will likely be removed the day after surgery. You may have to wear compression stockings. These stockings help to prevent blood clots and reduce swelling in your legs. You will be encouraged to walk as soon as possible. You will do breathing exercises or use a device to help keep your lungs clear. You may need to use a sanitary napkin for discharge from the vagina. Summary Abdominal hysterectomy is a surgical procedure to remove the uterus.  The uterus is the organ that holds a developing baby. This procedure can affect the way you feel about yourself. Talk with your health care provider about the physical and emotional changes this procedure may cause. You will be given pain medicine after the procedure. Ask your health care provider how long you will need to stay in the hospital after your procedure. This information is not intended to replace advice given to you by your health care provider. Make sure you discuss any questions you have with your health care provider. Document Revised: 10/20/2019 Document Reviewed: 10/20/2019 Elsevier Patient Education  [place]. Abdominal Hysterectomy, Care After The following information offers guidance on how to care for yourself after your procedure. Your health care provider may also give you more specific instructions. If you have problems or questions, contact your health care provider. What can I expect after the procedure? After the procedure, it is common to have: Pain. Tiredness (fatigue). Poor appetite. Less interest in sex. Vaginal bleeding and discharge. You may need to use a sanitary napkin after this procedure. Constipation. Feelings of sadness or other emotions. Follow these instructions at home: Medicines Take over-the-counter and prescription medicines only as told by your health care provider. If you were prescribed an antibiotic medicine, take it as told by your health care provider. Do not stop using the antibiotic even if you start to feel better. Ask your health care provider if the medicine prescribed to  you: Requires you to avoid driving or using machinery. Can cause constipation. You may need to take these actions to prevent or treat constipation: Drink enough fluid to keep your urine pale yellow. Take over-the-counter or prescription medicines. Eat foods that are high in fiber, such as beans, whole grains, and fresh fruits and vegetables. Limit foods  that are high in fat and processed sugars, such as fried or sweet foods. Incision care  Follow instructions from your health care provider about how to take care of your incision. Make sure you: Wash your hands with soap and water for at least 20 seconds before and after you change your bandage (dressing). If soap and water are not available, use hand sanitizer. Change your dressing as told by your health care provider. Leave stitches (sutures), skin glue, or adhesive strips in place. These skin closures may need to stay in place for 2 weeks or longer. If adhesive strip edges start to loosen and curl up, you may trim the loose edges. Do not remove adhesive strips completely unless your health care provider tells you to do that. Keep the dressing dry until your health care provider says it can be removed. Check your incision area every day for signs of infection. Check for: More redness, swelling, or pain. Fluid or blood. Warmth. Pus or a bad smell. Activity  Rest as told by your health care provider. Avoid sitting for a long time without moving. Get up to take short walks every 1-2 hours. This is important to improve blood flow and breathing. Ask for help if you feel weak or unsteady. Do not lift anything that is heavier than 10 lb (4.5 kg), or the limit that you are told, until your health care provider says that it is safe. Follow instructions from your health care provider about exercise, driving, and general activities. Return to your normal activities as told by your health care provider. Ask your health care provider what activities are safe for you. Lifestyle Do not douche, use tampons, or have sex for at least 6 weeks or as told by your health care provider. Do not drink alcohol until your health care provider approves. Do not use any products that contain nicotine or tobacco. These products include cigarettes, chewing tobacco, and vaping devices, such as e-cigarettes. These can delay  healing after surgery. If you need help quitting, ask your health care provider. General instructions If you struggle with physical or emotional changes after your procedure, speak with your health care provider or a therapist. Do not take baths, swim, or use a hot tub until your health care provider approves. Ask your health care provider if you may take showers. You may only be allowed to take sponge baths. Try to have a responsible adult at home with you for the first 1-2 weeks to help with your daily chores. Wear compression stockings as told by your health care provider. These stockings help to prevent blood clots and reduce swelling in your legs. Keep all follow-up visits. This is important. Contact a health care provider if: You have any of these signs of infection: Chills or a fever. More redness, swelling, warmth, or pain around your incision. Fluid or blood coming from your incision. Pus or a bad smell coming from your incision. Your incision opens. You feel dizzy or light-headed. You have pain or bleeding when you urinate. You have diarrhea that does not go away. You have nausea and vomiting that do not go away. You have pus or a  bad-smelling discharge coming from your vagina. You have any type of abnormal reaction such as a rash, or you develop an allergy to your medicine. Your pain medicine does not help. Get help right away if: You have a fever and your symptoms suddenly get worse. You have severe pain in your abdomen. You have shortness of breath. You faint. You have pain, swelling, or redness in your leg. You have heavy vaginal bleeding and blood clots, soaking through a sanitary napkin in less than 1 hour. These symptoms may represent a serious problem that is an emergency. Do not wait to see if the symptoms will go away. Get medical help right away. Call your local emergency services (911 in the [place] Do not drive yourself to the hospital. Summary After your  procedure, it is common to have pain, tiredness, and vaginal discharge. Do not lift anything that is heavier than 10 lb (4.5 kg), or the limit that you are told, until your health care provider says that it is safe. Follow instructions from your health care provider about exercise, driving, and general activities. Ask what activities are safe for you. Do not take baths, swim, or use a hot tub until your health care provider approves. Ask your health care provider if you may take showers. You may only be allowed to take sponge baths. Try to have a responsible adult at home with you for the first 1-2 weeks to help with your daily chores. This information is not intended to replace advice given to you by your health care provider. Make sure you discuss any questions you have with your health care provider. Document Revised: 04/03/2020 Document Reviewed: 10/20/2019 Elsevier Patient Education  2022 [employer].

## 2020-11-12 NOTE — Progress Notes (Signed)
History:  50 y.o. G1P0010 here today for surgical consult. Pt was seen by Dr. Elly Modena. In discussion of hysterectomy, pt requested eval for robotic approach. She is s/p prev myomectomy. She reports that if she has known that another surgery would be needed, she would not have had a myomectomy. Patient reports that she looks pregnant and has pressure sx related to the fibroids. She requests definitive treatment. Pt was found to have a fibroid uterus and bilateral ovarian cysts. Patient reports a monthly 7 day period. Patient reports the presence of pain for several months and presented to the ED in June as it worsened. She admits to vasomotor symptoms. She denies any other symptoms. She denies urinary complaints. Patient is requesting a hysterectomy   The following portions of the patient's history were reviewed and updated as appropriate: allergies, current medications, past family history, past medical history, past social history, past surgical history and problem list.  Review of Systems:  Pertinent items are noted in HPI.    Objective:  Physical Exam Blood pressure (!) 153/75, pulse 88, height '5\' 6"'$  (1.676 m), weight 176 lb (79.8 kg), last menstrual period 10/04/2020.  CONSTITUTIONAL: Well-developed, well-nourished female in no acute distress.  HENT:  Normocephalic, atraumatic EYES: Conjunctivae and EOM are normal. No scleral icterus.  NECK: Normal range of motion SKIN: Skin is warm and dry. No rash noted. Not diaphoretic.No pallor. [place]: Alert and oriented to person, place, and time. Normal coordination.  Abd: Soft, nontender and nondistended; uterus is palpated above the umbilicus. It appears to be mobile but has a wide base. Well helaed transverse incision.  Pelvic: narrow introitus noted. No descensus.    Labs and Imaging 08/22/2020 CLINICAL DATA:  Abdominal pain and distension.   EXAM: TRANSABDOMINAL ULTRASOUND OF PELVIS   TECHNIQUE: Transabdominal ultrasound examination of  the pelvis was performed including evaluation of the uterus, ovaries, adnexal regions, and pelvic cul-de-sac. Patient declined transvaginal exam.   COMPARISON:  None.   FINDINGS: Uterus   Measurements: 19.0 x 12.4 x 16.9 cm = volume: 2,086 mL. The uterus is enlarged, heterogeneous, with innumerable fibroids. The largest fibroid is seen in the fundus at the fundus measuring 11.2 x 7.8 x 8.8 cm. Technologist states the uterus extends beyond the umbilicus.   Endometrium   Obscured by fibroids, where visualized is normal measuring 3 mm.   Right ovary   Measurements: 7.4 x 4.3 x 6.6 cm = volume: 109 mL. There are 2 cysts in the ovary. Larger cyst measuring 6.4 x 3.4 x 4.9 cm, with question of slight thickening peripherally. Smaller cyst measures 3.4 x 2.7 x 2.6 cm. Blood flow is noted to the ovarian parenchyma.   Left ovary   Measurements: Majority of the ovary is replaced by a large cyst. Cyst measures 8.4 x 5.5 x 6.3 cm and has a peripheral echogenic focus with shadowing, possible calcifications. There is blood flow to the ovarian parenchyma peripherally.   Other findings:  No abnormal free fluid.   IMPRESSION: 1. Enlarged heterogeneous uterus with multiple uterine fibroids, largest fibroid at the fundus measures 11 cm. 2. Bilateral ovarian cysts. 8.4 cm cyst in the left ovary has a peripheral echogenic focus with shadowing, possible calcification. This may represent an a dermoid. A 6.4 cm cyst in the right ovary has questionable thickening peripherally. These do not meet criteria for simple cysts. Recommend gyn consultation and consideration for MRI characterization.    Assessment & Plan:  Iysis was seen today for consult.  Diagnoses and all  orders for this visit:  Fibroids, submucosal  Pelvic pressure in female  Bilateral ovarian cysts  Pelvic pain in female   Patient desires surgical management with TAH with possible BSO if there is the ability to presenve 1  ovary pt would like that but, understands that she may need a BSO. She has not contraindications to EES.  The risks of surgery were discussed in detail with the patient including but not limited to: bleeding which may require transfusion or reoperation; infection which may require prolonged hospitalization or re-hospitalization and antibiotic therapy; injury to bowel, bladder, ureters and major vessels or other surrounding organs; need for additional procedures including laparotomy; thromboembolic phenomenon, incisional problems and other postoperative or anesthesia complications.  Patient was told that the likelihood that her condition and symptoms will be treated effectively with this surgical management was very high; the postoperative expectations were also discussed in detail. The patient also understands the alternative treatment options which were discussed in full. All questions were answered.  She was told that she will be contacted by our surgical scheduler regarding the time and date of her surgery; routine preoperative instructions of having nothing to eat or drink after midnight on the day prior to surgery and also coming to the hospital 1 1/2 hours prior to her time of surgery were also emphasized.  She was told she may be called for a preoperative appointment about a week prior to surgery and will be given further preoperative instructions at that visit. Printed patient education handouts about the procedure were given to the patient to review at home.   Total face-to-face time with patient, review of chart, discussion with consultant and coordination of care was 88mn.     Jomarie Gellis L. Harraway-Smith, M.D., FMillbourne Harraway-Smith, M.D., FCherlynn June

## 2020-11-12 NOTE — Progress Notes (Signed)
Patient is here for consult for hysterectomy.

## 2020-11-14 ENCOUNTER — Encounter: Payer: Self-pay | Admitting: Obstetrics & Gynecology

## 2020-11-19 ENCOUNTER — Telehealth: Payer: Self-pay | Admitting: *Deleted

## 2020-11-19 ENCOUNTER — Encounter: Payer: Self-pay | Admitting: *Deleted

## 2020-11-19 NOTE — Telephone Encounter (Signed)
Call to patient to review surgery date. Left message to call back- will send My Chart message.

## 2020-12-04 NOTE — Telephone Encounter (Signed)
Call to patient. Confirmed surgery instructions received and denies questions. Reviewed six week recovery and will send FMLA forms for completion.   Encounter closed.

## 2020-12-15 ENCOUNTER — Encounter: Payer: Self-pay | Admitting: Radiology

## 2020-12-20 ENCOUNTER — Telehealth: Payer: Self-pay | Admitting: *Deleted

## 2020-12-20 NOTE — Telephone Encounter (Signed)
Call to patient. Advised of office emergency and need to reschedule surgery date. Optioons offered and patient would like to check schedule and call back.

## 2020-12-28 ENCOUNTER — Other Ambulatory Visit (HOSPITAL_COMMUNITY): Payer: 59

## 2021-01-04 ENCOUNTER — Encounter: Payer: Self-pay | Admitting: Obstetrics & Gynecology

## 2021-01-04 DIAGNOSIS — R102 Pelvic and perineal pain: Secondary | ICD-10-CM | POA: Insufficient documentation

## 2021-01-04 DIAGNOSIS — D250 Submucous leiomyoma of uterus: Secondary | ICD-10-CM | POA: Insufficient documentation

## 2021-01-04 DIAGNOSIS — D500 Iron deficiency anemia secondary to blood loss (chronic): Secondary | ICD-10-CM | POA: Insufficient documentation

## 2021-01-21 DIAGNOSIS — N83201 Unspecified ovarian cyst, right side: Secondary | ICD-10-CM

## 2021-01-21 NOTE — Progress Notes (Signed)
Surgical Instructions    Your procedure is scheduled on 01/29/21.  Report to [facility] Main Entrance "A" at 1:10 P.M., then check in with the Admitting office.  Call this number if you have problems the morning of surgery:  [phone]   If you have any questions prior to your surgery date call [phone]: Open Monday-Friday 8am-4pm    Remember:  Do not eat after midnight the night before your surgery  You may drink clear liquids until 12:10PM the day of your surgery.   Clear liquids allowed are: Water, Non-Citrus Juices (without pulp), Carbonated Beverages, Clear Tea, Black Coffee ONLY (NO MILK, CREAM OR POWDERED CREAMER of any kind), and Gatorade    Take these medicines the morning of surgery with A SIP OF WATER: NONE     As of today, STOP taking any Aspirin (unless otherwise instructed by your surgeon) Aleve, Naproxen, Ibuprofen, Motrin, Advil, Goody's, BC's, all herbal medications, fish oil, and all vitamins.     After your COVID test   You are not required to quarantine however you are required to wear a well-fitting mask when you are out and around people not in your household.  If your mask becomes wet or soiled, replace with a new one.  Wash your hands often with soap and water for 20 seconds or clean your hands with an alcohol-based hand sanitizer that contains at least 60% alcohol.  Do not share personal items.  Notify your provider: if you are in close contact with someone who has COVID  or if you develop a fever of 100.4 or greater, sneezing, cough, sore throat, shortness of breath or body aches.             Do not wear jewelry or makeup Do not wear lotions, powders, perfumes/colognes, or deodorant. Do not shave 48 hours prior to surgery.  Men may shave face and neck. Do not bring valuables to the hospital. DO Not wear nail polish, gel polish, artificial nails, or any other type of covering on natural nails including finger and toenails. If patients have  artificial nails, gel coating, etc. that need to be removed by a nail salon, please have this removed prior to surgery or surgery may need to be canceled/delayed if the surgeon/ anesthesia feels like the patient is unable to be adequately monitored.             [facility] is not responsible for any belongings or valuables.  Do NOT Smoke (Tobacco/Vaping)  24 hours prior to your procedure  If you use a CPAP at night, you may bring your mask for your overnight stay.   Contacts, glasses, hearing aids, dentures or partials may not be worn into surgery, please bring cases for these belongings   For patients admitted to the hospital, discharge time will be determined by your treatment team.   Patients discharged the day of surgery will not be allowed to drive home, and someone needs to stay with them for 24 hours.  NO VISITORS WILL BE ALLOWED IN PRE-OP WHERE PATIENTS ARE PREPPED FOR SURGERY.  ONLY 1 SUPPORT PERSON MAY BE PRESENT IN THE WAITING ROOM WHILE YOU ARE IN SURGERY.  IF YOU ARE TO BE ADMITTED, ONCE YOU ARE IN YOUR ROOM YOU WILL BE ALLOWED TWO (2) VISITORS. 1 (ONE) VISITOR MAY STAY OVERNIGHT BUT MUST ARRIVE TO THE ROOM BY 8pm.  Minor children may have two parents present. Special consideration for safety and communication needs will be reviewed on a case by  case basis.  Special instructions:    Oral Hygiene is also important to reduce your risk of infection.  Remember - BRUSH YOUR TEETH THE MORNING OF SURGERY WITH YOUR REGULAR TOOTHPASTE   [facility]- Preparing For Surgery  Before surgery, you can play an important role. Because skin is not sterile, your skin needs to be as free of germs as possible. You can reduce the number of germs on your skin by washing with CHG (chlorahexidine gluconate) Soap before surgery.  CHG is an antiseptic cleaner which kills germs and bonds with the skin to continue killing germs even after washing.     Please do not use if you have an allergy to CHG or  antibacterial soaps. If your skin becomes reddened/irritated stop using the CHG.  Do not shave (including legs and underarms) for at least 48 hours prior to first CHG shower. It is OK to shave your face.  Please follow these instructions carefully.     Shower the NIGHT BEFORE SURGERY and the MORNING OF SURGERY with CHG Soap.   If you chose to wash your hair, wash your hair first as usual with your normal shampoo. After you shampoo, rinse your hair and body thoroughly to remove the shampoo.  Then [employer] and genitals (private parts) with your normal soap and rinse thoroughly to remove soap.  After that Use CHG Soap as you would any other liquid soap. You can apply CHG directly to the skin and wash gently with a scrungie or a clean washcloth.   Apply the CHG Soap to your body ONLY FROM THE NECK DOWN.  Do not use on open wounds or open sores. Avoid contact with your eyes, ears, mouth and genitals (private parts). Wash Face and genitals (private parts)  with your normal soap.   Wash thoroughly, paying special attention to the area where your surgery will be performed.  Thoroughly rinse your body with warm water from the neck down.  DO NOT shower/wash with your normal soap after using and rinsing off the CHG Soap.  Pat yourself dry with a CLEAN TOWEL.  Wear CLEAN PAJAMAS to bed the night before surgery  Place CLEAN SHEETS on your bed the night before your surgery  DO NOT SLEEP WITH PETS.   Day of Surgery: Take a shower with CHG soap. Wear Clean/Comfortable clothing the morning of surgery Do not apply any deodorants/lotions.   Remember to brush your teeth WITH YOUR REGULAR TOOTHPASTE.   Please read over the following fact sheets that you were given.

## 2021-01-22 ENCOUNTER — Inpatient Hospital Stay (HOSPITAL_COMMUNITY)
Admission: RE | Admit: 2021-01-22 | Discharge: 2021-01-22 | Disposition: A | Discharge status: Home / Self Care | Payer: 59 | Source: Ambulatory Visit

## 2021-01-23 ENCOUNTER — Other Ambulatory Visit: Payer: Self-pay

## 2021-01-23 ENCOUNTER — Ambulatory Visit: Payer: 59 | Admitting: Emergency Medicine

## 2021-01-23 ENCOUNTER — Encounter: Payer: Self-pay | Admitting: Emergency Medicine

## 2021-01-23 VITALS — BP 158/80 | HR 83 | Temp 98.5°F | Ht 66.0 in | Wt 183.4 lb

## 2021-01-23 DIAGNOSIS — D250 Submucous leiomyoma of uterus: Secondary | ICD-10-CM

## 2021-01-23 DIAGNOSIS — D219 Benign neoplasm of connective and other soft tissue, unspecified: Secondary | ICD-10-CM | POA: Diagnosis not present

## 2021-01-23 DIAGNOSIS — D500 Iron deficiency anemia secondary to blood loss (chronic): Secondary | ICD-10-CM

## 2021-01-23 DIAGNOSIS — F17200 Nicotine dependence, unspecified, uncomplicated: Secondary | ICD-10-CM

## 2021-01-23 DIAGNOSIS — Z7689 Persons encountering health services in other specified circumstances: Secondary | ICD-10-CM | POA: Diagnosis not present

## 2021-01-23 DIAGNOSIS — I10 Essential (primary) hypertension: Secondary | ICD-10-CM | POA: Diagnosis not present

## 2021-01-23 MED ORDER — AMLODIPINE BESYLATE 5 MG PO TABS: 5.0000 mg | ORAL_TABLET | Freq: Every day | ORAL | 3 refills | Status: AC

## 2021-01-23 NOTE — Assessment & Plan Note (Signed)
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  Awaiting insurance approval for hysterectomy.  Sees gynecologist on a regular basis.

## 2021-01-23 NOTE — Assessment & Plan Note (Signed)
Elevated blood pressure readings in the office. BP Readings from Last 3 Encounters:  01/23/21 (!) 158/80  11/12/20 (!) 153/75  09/20/20 (!) 159/81  Will start amlodipine 5 mg daily. Advised to monitor blood pressure readings at home several times a day for the next following weeks and keep a log. Dietary approaches to stop hypertension discussed. Follow-up in 3 months.

## 2021-01-23 NOTE — Patient Instructions (Signed)

## 2021-01-23 NOTE — Assessment & Plan Note (Signed)
Cardiovascular and cancer risks associated with smoking discussed. °Smoking cessation advice given. °

## 2021-01-23 NOTE — Progress Notes (Signed)
Peggy Payne 50 y.o.   Chief Complaint  Patient presents with   New Patient (Initial Visit)    HISTORY OF PRESENT ILLNESS: This is a 50 y.o. female first visit to this office, here to establish care with me. Concerned about hypertension.  On no medications. Has history of chronic anemia secondary to multiple uterine fibroids.  Tentatively scheduled for hysterectomy next month, waiting on insurance approval. Chronic smoker 1 pack/week. No other complaints or medical concerns today. Up-to-date with mammogram.  No colonoscopy yet.  HPI   Prior to Admission medications   Medication Sig Start Date End Date Taking? Authorizing Provider  ferrous sulfate (FERROUSUL) 325 (65 FE) MG tablet Take 1 tablet (325 mg total) by mouth 2 (two) times daily. Patient not taking: Reported on 01/23/2021 09/20/20   Constant, Vickii Chafe, MD    No Known Allergies  Patient Active Problem List   Diagnosis Date Noted   Bilateral ovarian cysts 01/21/2021   Submucous uterine fibroid 01/04/2021   Chronic blood loss anemia 01/04/2021    Past Medical History:  Diagnosis Date   Anemia    Fibroid    Medical history non-contributory     Past Surgical History:  Procedure Laterality Date   MYOMECTOMY     MYOMECTOMY  2011    Social History   Socioeconomic History   Marital status: Single    Spouse name: Not on file   Number of children: Not on file   Years of education: Not on file   Highest education level: Not on file  Occupational History   Not on file  Tobacco Use   Smoking status: Every Day    Packs/day: 0.25    Years: 30.00    Pack years: 7.50    Types: Cigarettes   Smokeless tobacco: Never  Vaping Use   Vaping Use: Never used  Substance and Sexual Activity   Alcohol use: Yes    Comment: occasionally   Drug use: Never   Sexual activity: Yes  Other Topics Concern   Not on file  Social History Narrative   Not on file   Social Determinants of Health   Financial Resource Strain:  Not on file  Food Insecurity: Not on file  Transportation Needs: Not on file  Physical Activity: Not on file  Stress: Not on file  Social Connections: Not on file  Intimate Partner Violence: Not on file    Family History  Problem Relation Age of Onset   Hypertension Mother    Cancer Father      Review of Systems  Constitutional: Negative.  Negative for chills and fever.  HENT: Negative.  Negative for congestion and sore throat.   Respiratory: Negative.  Negative for cough and shortness of breath.   Cardiovascular: Negative.  Negative for chest pain and palpitations.  Gastrointestinal: Negative.  Negative for abdominal pain, diarrhea, nausea and vomiting.  Genitourinary: Negative.   Musculoskeletal: Negative.   Skin: Negative.  Negative for rash.  Neurological: Negative.  Negative for dizziness and headaches.  All other systems reviewed and are negative.   Physical Exam Vitals reviewed.  Constitutional:      Appearance: Normal appearance.  HENT:     Head: Normocephalic.  Eyes:     Extraocular Movements: Extraocular movements intact.     Pupils: Pupils are equal, round, and reactive to light.  Cardiovascular:     Rate and Rhythm: Normal rate and regular rhythm.     Pulses: Normal pulses.     Heart sounds:  Normal heart sounds.  Pulmonary:     Effort: Pulmonary effort is normal.     Breath sounds: Normal breath sounds.  Musculoskeletal:        General: Normal range of motion.     Cervical back: Normal range of motion and neck supple.  Skin:    General: Skin is warm and dry.     Capillary Refill: Capillary refill takes less than 2 seconds.  Neurological:     General: No focal deficit present.     Mental Status: She is alert and oriented to person, place, and time.  Psychiatric:        Mood and Affect: Mood normal.        Behavior: Behavior normal.     ASSESSMENT & PLAN: A total of 47 minutes was spent with the patient and counseling/coordination of care  regarding preparing for this visit, establishing care with me, review of most recent office visit notes, review of most recent blood work results, review of all medications, comprehensive history and physical examination, hypertension management and need to start medication amlodipine 5 mg daily, smoking and cardiovascular risks associated with it, smoking cessation advised, health maintenance items including need for colon cancer screening and colonoscopy, education on nutrition, prognosis, documentation and need for follow-up.  Problem List Items Addressed This Visit       Cardiovascular and Mediastinum   Essential hypertension - Primary    Elevated blood pressure readings in the office. BP Readings from Last 3 Encounters:  01/23/21 (!) 158/80  11/12/20 (!) 153/75  09/20/20 (!) 159/81  Will start amlodipine 5 mg daily. Advised to monitor blood pressure readings at home several times a day for the next following weeks and keep a log. Dietary approaches to stop hypertension discussed. Follow-up in 3 months.       Relevant Medications   amLODipine (NORVASC) 5 MG tablet     Genitourinary   Submucous uterine fibroid    Affecting quality of life and resulting in chronic anemia secondary to chronic blood loss.  Awaiting insurance approval for hysterectomy.  Sees gynecologist on a regular basis.        Other   Iron deficiency anemia due to chronic blood loss   Current smoker    Cardiovascular and cancer risks associated with smoking discussed. Smoking cessation advice given.      Fibroids   Other Visit Diagnoses     Encounter to establish care          Patient Instructions  Hypertension, Adult High blood pressure (hypertension) is when the force of blood pumping through the arteries is too strong. The arteries are the blood vessels that carry blood from the heart throughout the body. Hypertension forces the heart to work harder to pump blood and may cause arteries to become  narrow or stiff. Untreated or uncontrolled hypertension can cause a heart attack, heart failure, a stroke, kidney disease, and other problems. A blood pressure reading consists of a higher number over a lower number. Ideally, your blood pressure should be below 120/80. The first ("top") number is called the systolic pressure. It is a measure of the pressure in your arteries as your heart beats. The second ("bottom") number is called the diastolic pressure. It is a measure of the pressure in your arteries as the heart relaxes. What are the causes? The exact cause of this condition is not known. There are some conditions that result in or are related to high blood pressure. What increases the  risk? Some risk factors for high blood pressure are under your control. The following factors may make you more likely to develop this condition: Smoking. Having type 2 diabetes mellitus, high cholesterol, or both. Not getting enough exercise or physical activity. Being overweight. Having too much fat, sugar, calories, or salt (sodium) in your diet. Drinking too much alcohol. Some risk factors for high blood pressure may be difficult or impossible to change. Some of these factors include: Having chronic kidney disease. Having a family history of high blood pressure. Age. Risk increases with age. Race. You may be at higher risk if you are African American. Gender. Men are at higher risk than women before age 82. After age 29, women are at higher risk than men. Having obstructive sleep apnea. Stress. What are the signs or symptoms? High blood pressure may not cause symptoms. Very high blood pressure (hypertensive crisis) may cause: Headache. Anxiety. Shortness of breath. Nosebleed. Nausea and vomiting. Vision changes. Severe chest pain. Seizures. How is this diagnosed? This condition is diagnosed by measuring your blood pressure while you are seated, with your arm resting on a flat surface, your legs  uncrossed, and your feet flat on the floor. The cuff of the blood pressure monitor will be placed directly against the skin of your upper arm at the level of your heart. It should be measured at least twice using the same arm. Certain conditions can cause a difference in blood pressure between your right and left arms. Certain factors can cause blood pressure readings to be lower or higher than normal for a short period of time: When your blood pressure is higher when you are in a health care provider's office than when you are at home, this is called white coat hypertension. Most people with this condition do not need medicines. When your blood pressure is higher at home than when you are in a health care provider's office, this is called masked hypertension. Most people with this condition may need medicines to control blood pressure. If you have a high blood pressure reading during one visit or you have normal blood pressure with other risk factors, you may be asked to: Return on a different day to have your blood pressure checked again. Monitor your blood pressure at home for 1 week or longer. If you are diagnosed with hypertension, you may have other blood or imaging tests to help your health care provider understand your overall risk for other conditions. How is this treated? This condition is treated by making healthy lifestyle changes, such as eating healthy foods, exercising more, and reducing your alcohol intake. Your health care provider may prescribe medicine if lifestyle changes are not enough to get your blood pressure under control, and if: Your systolic blood pressure is above 130. Your diastolic blood pressure is above 80. Your personal target blood pressure may vary depending on your medical conditions, your age, and other factors. Follow these instructions at home: Eating and drinking  Eat a diet that is high in fiber and potassium, and low in sodium, added sugar, and fat. An  example eating plan is called the DASH (Dietary Approaches to Stop Hypertension) diet. To eat this way: Eat plenty of fresh fruits and vegetables. Try to fill one half of your plate at each meal with fruits and vegetables. Eat whole grains, such as whole-wheat pasta, brown rice, or whole-grain bread. Fill about one fourth of your plate with whole grains. Eat or drink low-fat dairy products, such as skim milk  or low-fat yogurt. Avoid fatty cuts of meat, processed or cured meats, and poultry with skin. Fill about one fourth of your plate with lean proteins, such as fish, chicken without skin, beans, eggs, or tofu. Avoid pre-made and processed foods. These tend to be higher in sodium, added sugar, and fat. Reduce your daily sodium intake. Most people with hypertension should eat less than 1,500 mg of sodium a day. Do not drink alcohol if: Your health care provider tells you not to drink. You are pregnant, may be pregnant, or are planning to become pregnant. If you drink alcohol: Limit how much you use to: 0-1 drink a day for women. 0-2 drinks a day for men. Be aware of how much alcohol is in your drink. In the [place] one drink equals one 12 oz bottle of beer (355 mL), one 5 oz glass of wine (148 mL), or one 1 oz glass of hard liquor (44 mL). Lifestyle  Work with your health care provider to maintain a healthy body weight or to lose weight. Ask what an ideal weight is for you. Get at least 30 minutes of exercise most days of the week. Activities may include walking, swimming, or biking. Include exercise to strengthen your muscles (resistance exercise), such as Pilates or lifting weights, as part of your weekly exercise routine. Try to do these types of exercises for 30 minutes at least 3 days a week. Do not use any products that contain nicotine or tobacco, such as cigarettes, e-cigarettes, and chewing tobacco. If you need help quitting, ask your health care provider. Monitor your blood pressure at  home as told by your health care provider. Keep all follow-up visits as told by your health care provider. This is important. Medicines Take over-the-counter and prescription medicines only as told by your health care provider. Follow directions carefully. Blood pressure medicines must be taken as prescribed. Do not skip doses of blood pressure medicine. Doing this puts you at risk for problems and can make the medicine less effective. Ask your health care provider about side effects or reactions to medicines that you should watch for. Contact a health care provider if you: Think you are having a reaction to a medicine you are taking. Have headaches that keep coming back (recurring). Feel dizzy. Have swelling in your ankles. Have trouble with your vision. Get help right away if you: Develop a severe headache or confusion. Have unusual weakness or numbness. Feel faint. Have severe pain in your chest or abdomen. Vomit repeatedly. Have trouble breathing. Summary Hypertension is when the force of blood pumping through your arteries is too strong. If this condition is not controlled, it may put you at risk for serious complications. Your personal target blood pressure may vary depending on your medical conditions, your age, and other factors. For most people, a normal blood pressure is less than 120/80. Hypertension is treated with lifestyle changes, medicines, or a combination of both. Lifestyle changes include losing weight, eating a healthy, low-sodium diet, exercising more, and limiting alcohol. This information is not intended to replace advice given to you by your health care provider. Make sure you discuss any questions you have with your health care provider. Document Revised: 10/28/2017 Document Reviewed: 10/28/2017 Elsevier Patient Education  2022 [place], MD [facility]

## 2021-01-28 ENCOUNTER — Encounter (HOSPITAL_COMMUNITY): Payer: Self-pay | Admitting: Obstetrics & Gynecology

## 2021-01-28 ENCOUNTER — Telehealth: Payer: Self-pay

## 2021-01-28 NOTE — Telephone Encounter (Signed)
Patient called the office upset that her surgery is tomorrow and she is going to cancel due to the surgery not having prior authorization.  I informed patient that Dr. Ihor Dow completed a peer to peer review on Wednesday November 23,2022 and got authorization for patient surgery. Patient states that no one has communicated to her that it was approved. Patient did not go to her pre op appointment last Tuesday because at that point the surgery was not authorized. Will route to surgery scheduler to have her call patient and ensure she should still proceed tomorrow with surgery. Kathrene Alu RN

## 2021-01-28 NOTE — Progress Notes (Deleted)
Called patient for PAT information and instructions for DOS.  Patient is cancelling this procedure.  Patient is calling MD's office to inform of them of cancellation.

## 2021-01-28 NOTE — Progress Notes (Signed)
DUE TO COVID-19 ONLY ONE VISITOR IS ALLOWED TO COME WITH YOU AND STAY IN THE WAITING ROOM ONLY DURING PRE OP AND PROCEDURE DAY OF SURGERY.   Two VISITORS MAY VISIT WITH YOU AFTER SURGERY IN YOUR PRIVATE ROOM DURING VISITING HOURS ONLY!  PCP - none Cardiologist - n/a  Chest x-ray - n/a EKG - n/a Stress Test - n/a ECHO - n/a Cardiac Cath - n/a  ICD Pacemaker/Loop - n/a  Sleep Study -  n/a CPAP - none  ERAS: Clear liquids til 12 Noon on the day of surgery.  Clear liquids were reviewed with patient via phone.  STOP now taking any Aspirin (unless otherwise instructed by your surgeon), Aleve, Naproxen, Ibuprofen, Motrin, Advil, Goody's, BC's, all herbal medications, fish oil, and all vitamins.   Coronavirus Screening Covid test is scheduled on DOS Do you have any of the following symptoms:  Cough yes/no: No Fever (>100.74F)  yes/no: No Runny nose yes/no: No Sore throat yes/no: No Difficulty breathing/shortness of breath  yes/no: No  Have you traveled in the last 14 days and where? yes/no: No  Patient verbalized understanding of instructions that were given via phone.

## 2021-01-29 ENCOUNTER — Encounter (HOSPITAL_COMMUNITY): Payer: Self-pay | Admitting: Obstetrics & Gynecology

## 2021-01-29 ENCOUNTER — Inpatient Hospital Stay (HOSPITAL_COMMUNITY): Payer: 59 | Admitting: Critical Care Medicine

## 2021-01-29 ENCOUNTER — Inpatient Hospital Stay (HOSPITAL_COMMUNITY)
Admission: RE | Admit: 2021-01-29 | Discharge: 2021-01-31 | DRG: 742 | Disposition: A | Discharge status: Home / Self Care | Payer: 59 | Attending: Obstetrics & Gynecology | Admitting: Obstetrics & Gynecology

## 2021-01-29 ENCOUNTER — Encounter (HOSPITAL_COMMUNITY)
Admission: RE | Disposition: A | Discharge status: Home / Self Care | Payer: Self-pay | Source: Home / Self Care | Attending: Obstetrics & Gynecology

## 2021-01-29 ENCOUNTER — Other Ambulatory Visit: Payer: Self-pay

## 2021-01-29 ENCOUNTER — Inpatient Hospital Stay (HOSPITAL_COMMUNITY): Payer: 59 | Admitting: Physician Assistant

## 2021-01-29 DIAGNOSIS — N8003 Adenomyosis of the uterus: Secondary | ICD-10-CM

## 2021-01-29 DIAGNOSIS — D219 Benign neoplasm of connective and other soft tissue, unspecified: Secondary | ICD-10-CM

## 2021-01-29 DIAGNOSIS — D62 Acute posthemorrhagic anemia: Secondary | ICD-10-CM | POA: Diagnosis not present

## 2021-01-29 DIAGNOSIS — D500 Iron deficiency anemia secondary to blood loss (chronic): Secondary | ICD-10-CM | POA: Diagnosis present

## 2021-01-29 DIAGNOSIS — I10 Essential (primary) hypertension: Secondary | ICD-10-CM | POA: Diagnosis present

## 2021-01-29 DIAGNOSIS — D259 Leiomyoma of uterus, unspecified: Secondary | ICD-10-CM

## 2021-01-29 DIAGNOSIS — N840 Polyp of corpus uteri: Secondary | ICD-10-CM | POA: Diagnosis not present

## 2021-01-29 DIAGNOSIS — F17210 Nicotine dependence, cigarettes, uncomplicated: Secondary | ICD-10-CM | POA: Diagnosis present

## 2021-01-29 DIAGNOSIS — N736 Female pelvic peritoneal adhesions (postinfective): Secondary | ICD-10-CM | POA: Diagnosis present

## 2021-01-29 DIAGNOSIS — N83201 Unspecified ovarian cyst, right side: Secondary | ICD-10-CM | POA: Diagnosis present

## 2021-01-29 DIAGNOSIS — Z8249 Family history of ischemic heart disease and other diseases of the circulatory system: Secondary | ICD-10-CM | POA: Diagnosis not present

## 2021-01-29 DIAGNOSIS — R102 Pelvic and perineal pain: Secondary | ICD-10-CM | POA: Diagnosis present

## 2021-01-29 DIAGNOSIS — N83202 Unspecified ovarian cyst, left side: Secondary | ICD-10-CM | POA: Diagnosis present

## 2021-01-29 DIAGNOSIS — N7011 Chronic salpingitis: Secondary | ICD-10-CM | POA: Diagnosis present

## 2021-01-29 DIAGNOSIS — Z79899 Other long term (current) drug therapy: Secondary | ICD-10-CM

## 2021-01-29 DIAGNOSIS — D250 Submucous leiomyoma of uterus: Principal | ICD-10-CM | POA: Diagnosis present

## 2021-01-29 DIAGNOSIS — Z20822 Contact with and (suspected) exposure to covid-19: Secondary | ICD-10-CM | POA: Diagnosis present

## 2021-01-29 DIAGNOSIS — Z9889 Other specified postprocedural states: Secondary | ICD-10-CM

## 2021-01-29 DIAGNOSIS — F17200 Nicotine dependence, unspecified, uncomplicated: Secondary | ICD-10-CM | POA: Diagnosis present

## 2021-01-29 HISTORY — DX: Essential (primary) hypertension: I10

## 2021-01-29 HISTORY — PX: HYSTERECTOMY ABDOMINAL WITH SALPINGO-OOPHORECTOMY: SHX6792

## 2021-01-29 LAB — BASIC METABOLIC PANEL
Anion gap: 11 (ref 5–15)
BUN: 13 mg/dL (ref 6–20)
CO2: 21 mmol/L — ABNORMAL LOW (ref 22–32)
Calcium: 9.2 mg/dL (ref 8.9–10.3)
Chloride: 104 mmol/L (ref 98–111)
Creatinine, Ser: 0.73 mg/dL (ref 0.44–1.00)
GFR, Estimated: >60 mL/min (ref >60)
Glucose, Bld: 105 mg/dL — ABNORMAL HIGH (ref 70–99)
Potassium: 3.6 mmol/L (ref 3.5–5.1)
Sodium: 136 mmol/L (ref 135–145)

## 2021-01-29 LAB — CBC
HCT: 35.4 % — ABNORMAL LOW (ref 36.0–46.0)
Hemoglobin: 10.2 g/dL — ABNORMAL LOW (ref 12.0–15.0)
MCH: 20.6 pg — ABNORMAL LOW (ref 26.0–34.0)
MCHC: 28.8 g/dL — ABNORMAL LOW (ref 30.0–36.0)
MCV: 71.7 fL — ABNORMAL LOW (ref 80.0–100.0)
Platelets: 451 10*3/uL — ABNORMAL HIGH (ref 150–400)
RBC: 4.94 MIL/uL (ref 3.87–5.11)
RDW: 18.3 % — ABNORMAL HIGH (ref 11.5–15.5)
WBC: 5.9 10*3/uL (ref 4.0–10.5)
nRBC: 0 % (ref 0.0–0.2)

## 2021-01-29 LAB — POCT PREGNANCY, URINE: Preg Test, Ur: NEGATIVE

## 2021-01-29 LAB — SARS CORONAVIRUS 2 BY RT PCR (HOSPITAL ORDER, PERFORMED IN ~~LOC~~ HOSPITAL LAB): SARS Coronavirus 2: NEGATIVE

## 2021-01-29 LAB — TYPE AND SCREEN
ABO/RH(D): O POS
Antibody Screen: NEGATIVE

## 2021-01-29 LAB — ABO/RH: ABO/RH(D): O POS

## 2021-01-29 SURGERY — HYSTERECTOMY, ABDOMINAL, WITH SALPINGO-OOPHORECTOMY
Anesthesia: Regional | Laterality: Bilateral

## 2021-01-29 MED ORDER — ONDANSETRON HCL 4 MG/2ML IJ SOLN
INTRAMUSCULAR | Status: DC | PRN
  Administered 2021-01-29: 4 mg via INTRAVENOUS

## 2021-01-29 MED ORDER — AMLODIPINE BESYLATE 5 MG PO TABS
5.0000 mg | ORAL_TABLET | Freq: Every day | ORAL | Status: DC
  Administered 2021-01-30 – 2021-01-31 (×2): 5 mg via ORAL
  Filled 2021-01-29 (×2): qty 1

## 2021-01-29 MED ORDER — LIDOCAINE HCL (CARDIAC) PF 100 MG/5ML IV SOSY
PREFILLED_SYRINGE | INTRAVENOUS | Status: DC | PRN
  Administered 2021-01-29: 100 mg via INTRATRACHEAL

## 2021-01-29 MED ORDER — CEFAZOLIN SODIUM-DEXTROSE 2-4 GM/100ML-% IV SOLN
2.0000 g | INTRAVENOUS | Status: AC
Start: 2021-01-29 — End: 2021-01-29
  Administered 2021-01-29: 2 g via INTRAVENOUS

## 2021-01-29 MED ORDER — ROCURONIUM BROMIDE 100 MG/10ML IV SOLN
INTRAVENOUS | Status: DC | PRN
  Administered 2021-01-29: 50 mg via INTRAVENOUS
  Administered 2021-01-29: 20 mg via INTRAVENOUS
  Administered 2021-01-29: 30 mg via INTRAVENOUS

## 2021-01-29 MED ORDER — HYDROMORPHONE HCL 1 MG/ML IJ SOLN
0.2500 mg | INTRAMUSCULAR | Status: DC | PRN
  Administered 2021-01-29 (×4): 0.5 mg via INTRAVENOUS

## 2021-01-29 MED ORDER — HEMOSTATIC AGENTS (NO CHARGE) OPTIME
TOPICAL | Status: DC | PRN
  Administered 2021-01-29: 2 via TOPICAL

## 2021-01-29 MED ORDER — POLYETHYLENE GLYCOL 3350 17 G PO PACK: 17.0000 g | PACK | Freq: Every day | ORAL | Status: DC | PRN

## 2021-01-29 MED ORDER — PHENYLEPHRINE HCL (PRESSORS) 10 MG/ML IV SOLN
INTRAVENOUS | Status: DC | PRN
  Administered 2021-01-29: 80 ug via INTRAVENOUS

## 2021-01-29 MED ORDER — DEXAMETHASONE SODIUM PHOSPHATE 4 MG/ML IJ SOLN: INTRAMUSCULAR | Status: DC | PRN

## 2021-01-29 MED ORDER — LACTATED RINGERS IV SOLN
INTRAVENOUS | Status: DC
Start: 2021-01-29 — End: 2021-01-30

## 2021-01-29 MED ORDER — KETOROLAC TROMETHAMINE 30 MG/ML IJ SOLN
30.0000 mg | Freq: Once | INTRAMUSCULAR | Status: AC
  Administered 2021-01-29: 30 mg via INTRAVENOUS

## 2021-01-29 MED ORDER — 0.9 % SODIUM CHLORIDE (POUR BTL) OPTIME
TOPICAL | Status: DC | PRN
  Administered 2021-01-29: 1000 mL

## 2021-01-29 MED ORDER — ACETAMINOPHEN 10 MG/ML IV SOLN
INTRAVENOUS | Status: DC | PRN
  Administered 2021-01-29: 1000 mg via INTRAVENOUS

## 2021-01-29 MED ORDER — OXYCODONE-ACETAMINOPHEN 5-325 MG PO TABS
1.0000 | ORAL_TABLET | ORAL | Status: DC | PRN
  Administered 2021-01-30 – 2021-01-31 (×4): 2 via ORAL
  Filled 2021-01-29 (×4): qty 2

## 2021-01-29 MED ORDER — DEXAMETHASONE SODIUM PHOSPHATE 4 MG/ML IJ SOLN
INTRAMUSCULAR | Status: DC | PRN
  Administered 2021-01-29 (×2): 3 mg

## 2021-01-29 MED ORDER — HYDROMORPHONE HCL 1 MG/ML IJ SOLN
0.2000 mg | INTRAMUSCULAR | Status: DC | PRN
  Administered 2021-01-29 – 2021-01-30 (×2): 0.6 mg via INTRAVENOUS
  Filled 2021-01-29: qty 1

## 2021-01-29 MED ORDER — IBUPROFEN 600 MG PO TABS
600.0000 mg | ORAL_TABLET | Freq: Four times a day (QID) | ORAL | Status: DC
  Administered 2021-01-31 (×4): 600 mg via ORAL
  Filled 2021-01-29 (×4): qty 1

## 2021-01-29 MED ORDER — KETOROLAC TROMETHAMINE 30 MG/ML IJ SOLN
30.0000 mg | Freq: Four times a day (QID) | INTRAMUSCULAR | Status: AC
  Administered 2021-01-30 (×4): 30 mg via INTRAVENOUS
  Filled 2021-01-29 (×4): qty 1

## 2021-01-29 MED ORDER — TRAMADOL HCL 50 MG PO TABS: 50.0000 mg | ORAL_TABLET | Freq: Four times a day (QID) | ORAL | Status: DC | PRN

## 2021-01-29 MED ORDER — POVIDONE-IODINE 10 % EX SWAB
2.0000 | Freq: Once | CUTANEOUS | Status: AC
Start: 2021-01-29 — End: 2021-01-29
  Administered 2021-01-29: 2 via TOPICAL

## 2021-01-29 MED ORDER — DOCUSATE SODIUM 100 MG PO CAPS
100.0000 mg | ORAL_CAPSULE | Freq: Two times a day (BID) | ORAL | Status: DC
  Administered 2021-01-30 – 2021-01-31 (×4): 100 mg via ORAL
  Filled 2021-01-29 (×4): qty 1

## 2021-01-29 MED ORDER — ROPIVACAINE HCL 5 MG/ML IJ SOLN: INTRAMUSCULAR | Status: DC | PRN

## 2021-01-29 MED ORDER — ONDANSETRON HCL 4 MG PO TABS: 4.0000 mg | ORAL_TABLET | Freq: Four times a day (QID) | ORAL | Status: DC | PRN

## 2021-01-29 MED ORDER — ROPIVACAINE HCL 5 MG/ML IJ SOLN
INTRAMUSCULAR | Status: DC | PRN
  Administered 2021-01-29 (×2): 20 mL via PERINEURAL

## 2021-01-29 MED ORDER — FENTANYL CITRATE (PF) 100 MCG/2ML IJ SOLN
25.0000 ug | INTRAMUSCULAR | Status: DC | PRN
Start: 2021-01-29 — End: 2021-01-30
  Administered 2021-01-29 (×3): 50 ug via INTRAVENOUS

## 2021-01-29 MED ORDER — MENTHOL 3 MG MT LOZG: 1.0000 | LOZENGE | OROMUCOSAL | Status: DC | PRN

## 2021-01-29 MED ORDER — BISACODYL 10 MG RE SUPP: 10.0000 mg | Freq: Every day | RECTAL | Status: DC | PRN

## 2021-01-29 MED ORDER — CLONIDINE HCL (ANALGESIA) 100 MCG/ML EP SOLN
EPIDURAL | Status: DC | PRN
  Administered 2021-01-29 (×2): 50 ug

## 2021-01-29 MED ORDER — ACETAMINOPHEN 10 MG/ML IV SOLN: 1000.0000 mg | Freq: Once | INTRAVENOUS | Status: DC | PRN

## 2021-01-29 MED ORDER — MIDAZOLAM HCL 5 MG/5ML IJ SOLN
INTRAMUSCULAR | Status: DC | PRN
  Administered 2021-01-29 (×2): 1 mg via INTRAVENOUS

## 2021-01-29 MED ORDER — PROPOFOL 10 MG/ML IV BOLUS
INTRAVENOUS | Status: DC | PRN
  Administered 2021-01-29: 150 mg via INTRAVENOUS

## 2021-01-29 MED ORDER — PROMETHAZINE HCL 25 MG/ML IJ SOLN
6.2500 mg | INTRAMUSCULAR | Status: DC | PRN
  Administered 2021-01-29: 12.5 mg via INTRAVENOUS

## 2021-01-29 MED ORDER — FENTANYL CITRATE (PF) 250 MCG/5ML IJ SOLN
INTRAMUSCULAR | Status: DC | PRN
  Administered 2021-01-29 (×6): 50 ug via INTRAVENOUS

## 2021-01-29 MED ORDER — GABAPENTIN 100 MG PO CAPS
100.0000 mg | ORAL_CAPSULE | ORAL | Status: AC
  Administered 2021-01-29: 100 mg via ORAL

## 2021-01-29 MED ORDER — CHLORHEXIDINE GLUCONATE 0.12 % MT SOLN
15.0000 mL | Freq: Once | OROMUCOSAL | Status: AC
  Administered 2021-01-29: 15 mL via OROMUCOSAL

## 2021-01-29 MED ORDER — MIDAZOLAM HCL 2 MG/2ML IJ SOLN
2.0000 mg | Freq: Once | INTRAMUSCULAR | Status: AC
  Administered 2021-01-29: 2 mg via INTRAVENOUS

## 2021-01-29 MED ORDER — FENTANYL CITRATE (PF) 100 MCG/2ML IJ SOLN
50.0000 ug | Freq: Once | INTRAMUSCULAR | Status: AC
  Administered 2021-01-29: 50 ug via INTRAVENOUS

## 2021-01-29 MED ORDER — PHENYLEPHRINE HCL-NACL 20-0.9 MG/250ML-% IV SOLN
INTRAVENOUS | Status: DC | PRN
  Administered 2021-01-29: 25 ug/min via INTRAVENOUS

## 2021-01-29 MED ORDER — PANTOPRAZOLE SODIUM 40 MG PO TBEC
40.0000 mg | DELAYED_RELEASE_TABLET | Freq: Every day | ORAL | Status: DC
  Administered 2021-01-30 – 2021-01-31 (×3): 40 mg via ORAL
  Filled 2021-01-29 (×3): qty 1

## 2021-01-29 MED ORDER — SUGAMMADEX SODIUM 200 MG/2ML IV SOLN
INTRAVENOUS | Status: DC | PRN
  Administered 2021-01-29: 200 mg via INTRAVENOUS

## 2021-01-29 MED ORDER — BUPIVACAINE HCL (PF) 0.5 % IJ SOLN
INTRAMUSCULAR | Status: DC | PRN
  Administered 2021-01-29: 10 mL

## 2021-01-29 MED ORDER — ZOLPIDEM TARTRATE 5 MG PO TABS: 5.0000 mg | ORAL_TABLET | Freq: Every evening | ORAL | Status: DC | PRN

## 2021-01-29 MED ORDER — ONDANSETRON HCL 4 MG/2ML IJ SOLN: 4.0000 mg | Freq: Four times a day (QID) | INTRAMUSCULAR | Status: DC | PRN

## 2021-01-29 MED ORDER — ORAL CARE MOUTH RINSE: 15.0000 mL | Freq: Once | OROMUCOSAL | Status: AC

## 2021-01-29 MED ORDER — DEXAMETHASONE SODIUM PHOSPHATE 10 MG/ML IJ SOLN
INTRAMUSCULAR | Status: DC | PRN
  Administered 2021-01-29: 4 mg via INTRAVENOUS

## 2021-01-29 MED ORDER — KCL IN DEXTROSE-NACL 20-5-0.45 MEQ/L-%-% IV SOLN
INTRAVENOUS | Status: AC
  Filled 2021-01-29: qty 1000

## 2021-01-29 MED ORDER — SIMETHICONE 80 MG PO CHEW: 80.0000 mg | CHEWABLE_TABLET | Freq: Four times a day (QID) | ORAL | Status: DC | PRN

## 2021-01-29 SURGICAL SUPPLY — 39 items
APL SKNCLS STERI-STRIP NONHPOA (GAUZE/BANDAGES/DRESSINGS) ×1
BARRIER ADHS 3X4 INTERCEED (GAUZE/BANDAGES/DRESSINGS) IMPLANT
BENZOIN TINCTURE PRP APPL 2/3 (GAUZE/BANDAGES/DRESSINGS) ×2 IMPLANT
BRR ADH 4X3 ABS CNTRL BYND (GAUZE/BANDAGES/DRESSINGS)
DRSG OPSITE POSTOP 4X10 (GAUZE/BANDAGES/DRESSINGS) ×2 IMPLANT
DRSG PAD ABDOMINAL 8X10 ST (GAUZE/BANDAGES/DRESSINGS) IMPLANT
DURAPREP 26ML APPLICATOR (WOUND CARE) ×2 IMPLANT
GAUZE 4X4 16PLY ~~LOC~~+RFID DBL (SPONGE) ×4 IMPLANT
GAUZE SPONGE 4X4 12PLY STRL (GAUZE/BANDAGES/DRESSINGS) IMPLANT
GLOVE SURG ENC MOIS LTX SZ7 (GLOVE) ×2 IMPLANT
GLOVE SURG UNDER POLY LF SZ7 (GLOVE) ×8 IMPLANT
GOWN STRL REUS W/ TWL LRG LVL3 (GOWN DISPOSABLE) ×2 IMPLANT
GOWN STRL REUS W/ TWL XL LVL3 (GOWN DISPOSABLE) ×1 IMPLANT
GOWN STRL REUS W/TWL LRG LVL3 (GOWN DISPOSABLE) ×4
GOWN STRL REUS W/TWL XL LVL3 (GOWN DISPOSABLE) ×2
HEMOSTAT ARISTA ABSORB 3G PWDR (HEMOSTASIS) ×4 IMPLANT
HIBICLENS CHG 4% 4OZ BTL (MISCELLANEOUS) ×2 IMPLANT
KIT TURNOVER KIT B (KITS) ×2 IMPLANT
LIGASURE IMPACT 36 18CM CVD LR (INSTRUMENTS) IMPLANT
NEEDLE HYPO 22GX1.5 SAFETY (NEEDLE) ×2 IMPLANT
NS IRRIG 1000ML POUR BTL (IV SOLUTION) ×2 IMPLANT
PACK ABDOMINAL GYN (CUSTOM PROCEDURE TRAY) ×2 IMPLANT
PAD ARMBOARD 7.5X6 YLW CONV (MISCELLANEOUS) ×2 IMPLANT
PAD OB MATERNITY 4.3X12.25 (PERSONAL CARE ITEMS) ×2 IMPLANT
SPONGE INTESTINAL PEANUT (DISPOSABLE) ×4 IMPLANT
SPONGE T-LAP 18X18 ~~LOC~~+RFID (SPONGE) ×12 IMPLANT
STRIP CLOSURE SKIN 1/2X4 (GAUZE/BANDAGES/DRESSINGS) ×2 IMPLANT
SUT VIC AB 0 CT1 18XCR BRD8 (SUTURE) ×4 IMPLANT
SUT VIC AB 0 CT1 27 (SUTURE) ×4
SUT VIC AB 0 CT1 27XBRD ANBCTR (SUTURE) ×2 IMPLANT
SUT VIC AB 0 CT1 36 (SUTURE) ×2 IMPLANT
SUT VIC AB 0 CT1 8-18 (SUTURE) ×8
SUT VIC AB 3-0 CT1 27 (SUTURE) ×2
SUT VIC AB 3-0 CT1 TAPERPNT 27 (SUTURE) ×1 IMPLANT
SUT VIC AB 4-0 KS 27 (SUTURE) ×2 IMPLANT
SUT VICRYL 0 TIES 12 18 (SUTURE) ×2 IMPLANT
SYR CONTROL 10ML LL (SYRINGE) ×2 IMPLANT
TOWEL GREEN STERILE FF (TOWEL DISPOSABLE) ×2 IMPLANT
TRAY FOLEY W/BAG SLVR 14FR (SET/KITS/TRAYS/PACK) ×2 IMPLANT

## 2021-01-29 NOTE — H&P (Signed)
Preoperative History and Physical  Peggy Payne is a 50 y.o. G1P0010 here for surgical management of uterine fibroids and bilateral adnexal masses.    Proposed surgery: Total abdominal hysterectomy with bilateral salpingo-oophorectomy. If one of the ovaries is not involved, will attempt to leave.    Past Medical History:  Diagnosis Date   Anemia    Fibroid    Hypertension    Past Surgical History:  Procedure Laterality Date   MYOMECTOMY  03/03/2009   WISDOM TOOTH EXTRACTION     OB History     Gravida  1   Para      Term      Preterm      AB  1   Living         SAB      IAB  1   Ectopic      Multiple      Live Births             Patient denies any cervical dysplasia or STIs. Medications Prior to Admission  Medication Sig Dispense Refill Last Dose   ferrous sulfate (FERROUSUL) 325 (65 FE) MG tablet Take 1 tablet (325 mg total) by mouth 2 (two) times daily. (Patient not taking: Reported on 01/23/2021) 60 tablet 1    amLODipine (NORVASC) 5 MG tablet Take 1 tablet (5 mg total) by mouth daily. 90 tablet 3     No Known Allergies Social History:   reports that she has been smoking cigarettes. She has a 7.50 pack-year smoking history. She has never used smokeless tobacco. She reports current alcohol use. She reports that she does not use drugs. Family History  Problem Relation Age of Onset   Hypertension Mother    Cancer Father     Review of Systems: Noncontributory  PHYSICAL EXAM: Blood pressure (!) 177/87, pulse 80, temperature 98.3 F (36.8 C), temperature source Oral, resp. rate 18, height 5\' 6"  (1.676 m), weight 83 kg, last menstrual period 01/26/2021, SpO2 97 %. General appearance - alert, well appearing, and in no distress Chest - clear to auscultation, no wheezes, rales or rhonchi, symmetric air entry Heart - normal rate and regular rhythm Abdomen - soft, nontender, nondistended, no masses or organomegaly Pelvic - see ofc H&P. 19 weeks sized  uterus.  Extremities - peripheral pulses normal, no pedal edema, no clubbing or cyanosis  Labs: Results for orders placed or performed during the hospital encounter of 01/29/21 (from the past 336 hour(s))  SARS Coronavirus 2 by RT PCR (hospital order, performed in [place] hospital lab) Nasopharyngeal Nasopharyngeal Swab   Collection Time: 01/29/21 11:08 AM   Specimen: Nasopharyngeal Swab  Result Value Ref Range   SARS Coronavirus 2 NEGATIVE NEGATIVE  CBC   Collection Time: 01/29/21 11:11 AM  Result Value Ref Range   WBC 5.9 4.0 - 10.5 K/uL   RBC 4.94 3.87 - 5.11 MIL/uL   Hemoglobin 10.2 (L) 12.0 - 15.0 g/dL   HCT 35.4 (L) 36.0 - 46.0 %   MCV 71.7 (L) 80.0 - 100.0 fL   MCH 20.6 (L) 26.0 - 34.0 pg   MCHC 28.8 (L) 30.0 - 36.0 g/dL   RDW 18.3 (H) 11.5 - 15.5 %   Platelets 451 (H) 150 - 400 K/uL   nRBC 0.0 0.0 - 0.2 %  Basic metabolic panel   Collection Time: 01/29/21 11:11 AM  Result Value Ref Range   Sodium 136 135 - 145 mmol/L   Potassium 3.6 3.5 - 5.1 mmol/L  Chloride 104 98 - 111 mmol/L   CO2 21 (L) 22 - 32 mmol/L   Glucose, Bld 105 (H) 70 - 99 mg/dL   BUN 13 6 - 20 mg/dL   Creatinine, Ser 0.73 0.44 - 1.00 mg/dL   Calcium 9.2 8.9 - 10.3 mg/dL   GFR, Estimated >60 >60 mL/min   Anion gap 11 5 - 15  Type and screen [place]   Collection Time: 01/29/21 11:40 AM  Result Value Ref Range   ABO/RH(D) O POS    Antibody Screen NEG    Sample Expiration      02/01/2021,2359 Performed at [facility] [address] [place], [place] [address]   ABO/Rh   Collection Time: 01/29/21 11:45 AM  Result Value Ref Range   ABO/RH(D)      O POS Performed at [facility] [address], [place], [place] [address]   Pregnancy, urine POC   Collection Time: 01/29/21 12:16 PM  Result Value Ref Range   Preg Test, Ur NEGATIVE NEGATIVE    Imaging Studies: 08/22/2020 CLINICAL DATA:  Abdominal pain and distension.   EXAM: TRANSABDOMINAL ULTRASOUND OF  PELVIS   TECHNIQUE: Transabdominal ultrasound examination of the pelvis was performed including evaluation of the uterus, ovaries, adnexal regions, and pelvic cul-de-sac. Patient declined transvaginal exam.   COMPARISON:  None.   FINDINGS: Uterus   Measurements: 19.0 x 12.4 x 16.9 cm = volume: 2,086 mL. The uterus is enlarged, heterogeneous, with innumerable fibroids. The largest fibroid is seen in the fundus at the fundus measuring 11.2 x 7.8 x 8.8 cm. Technologist states the uterus extends beyond the umbilicus.   Endometrium   Obscured by fibroids, where visualized is normal measuring 3 mm.   Right ovary   Measurements: 7.4 x 4.3 x 6.6 cm = volume: 109 mL. There are 2 cysts in the ovary. Larger cyst measuring 6.4 x 3.4 x 4.9 cm, with question of slight thickening peripherally. Smaller cyst measures 3.4 x 2.7 x 2.6 cm. Blood flow is noted to the ovarian parenchyma.   Left ovary   Measurements: Majority of the ovary is replaced by a large cyst. Cyst measures 8.4 x 5.5 x 6.3 cm and has a peripheral echogenic focus with shadowing, possible calcifications. There is blood flow to the ovarian parenchyma peripherally.   Other findings:  No abnormal free fluid.   IMPRESSION: 1. Enlarged heterogeneous uterus with multiple uterine fibroids, largest fibroid at the fundus measures 11 cm. 2. Bilateral ovarian cysts. 8.4 cm cyst in the left ovary has a peripheral echogenic focus with shadowing, possible calcification. This may represent an a dermoid. A 6.4 cm cyst in the right ovary has questionable thickening peripherally. These do not meet criteria for simple cysts. Recommend gyn consultation and consideration for MRI characterization.    Assessment: Patient Active Problem List   Diagnosis Date Noted   Post-operative state 01/29/2021   Essential hypertension 01/23/2021   Current smoker 01/23/2021   Fibroids 01/23/2021   Bilateral ovarian cysts 01/21/2021   Submucous  uterine fibroid 01/04/2021   Iron deficiency anemia due to chronic blood loss 01/04/2021    Plan: Patient will undergo surgical management with Total abdominal hysterectomy with bilateral salpingo-oophorectomy.   The risks of surgery were discussed in detail with the patient including but not limited to: bleeding which may require transfusion or reoperation; infection which may require antibiotics; injury to surrounding organs which may involve bowel, bladder, ureters ; need for additional procedures including  laparoscopy or laparotomy; thromboembolic phenomenon, surgical site problems and other postoperative/anesthesia complications. Likelihood of success in alleviating the patient's condition was discussed. Routine postoperative instructions will be reviewed with the patient and her family in detail after surgery.  The patient concurred with the proposed plan, giving informed written consent for the surgery.  Patient has been NPO since last night she will remain NPO for procedure.  Anesthesia and OR aware.  Preoperative prophylactic antibiotics and SCDs ordered on call to the OR.  To OR when ready.  Tiffany Calmes L. Ihor Dow, M.D., [facility] 01/29/2021 1:03 PM  Uterine

## 2021-01-29 NOTE — Op Note (Signed)
01/29/2021  4:43 PM  PATIENT:  Peggy Payne  50 y.o. female  PRE-OPERATIVE DIAGNOSIS:  Uterine fibroids  POST-OPERATIVE DIAGNOSIS:  Uterine fibroids  PROCEDURE:  Procedure(s) with comments: SUPRACERVICAL ABDOMINAL HYSTERECTOMY  WITH BILATERAL SALPINGO AND LEFT OOPHORECTOMY (Bilateral) - TAP Block  SURGEON:  Surgeon(s) and Role:    * Lavonia Drafts, MD - Primary    * Megan Salon, MD - Assisting  ANESTHESIA:   general and TAP block   EBL:  350 mL   BLOOD ADMINISTERED:none  DRAINS: none   LOCAL MEDICATIONS USED:  MARCAINE     SPECIMEN:  Source of Specimen:  uterus, left ovary and bilateral fallopian tubes.   DISPOSITION OF SPECIMEN:  PATHOLOGY  COUNTS:  YES  TOURNIQUET:  * No tourniquets in log *  DICTATION: .Note written in EPIC  PLAN OF CARE: Admit to inpatient   PATIENT DISPOSITION:  PACU - hemodynamically stable.   Delay start of Pharmacological VTE agent (>24hrs) due to surgical blood loss or risk of bleeding: yes  Complications: none immediate  INDICATIONS: The patient is a 50 y.o. G1P0010 with the aforementioned diagnoses who desires definitive surgical management. She is s/p previous myomectomy. On the preoperative visit, the risks, benefits, indications, and alternatives of the procedure were reviewed with the patient.  On the day of surgery, the risks of surgery were again discussed with the patient including but not limited to: bleeding which may require transfusion or reoperation; infection which may require antibiotics; injury to bowel, bladder, ureters or other surrounding organs; need for additional procedures; thromboembolic phenomenon, incisional problems and other postoperative/anesthesia complications. Written informed consent was obtained.    OPERATIVE FINDINGS: A 20 week size uterus. Upon opening, the uterus was incased in adhesions. There were filmy and dense adhesions of bowel to the posterior uterus and the pelvic side wall. There were  also dense pelvic adhesions to the pelvic side wall. The right fallopian tube contained a hydrosalpinx. The right ovary appeared small and normal. The left fallopian tube and ovary were enlarged and densely adherent to  each other.   DESCRIPTION OF PROCEDURE:  The patient received intravenous antibiotics and had sequential compression devices applied to her lower extremities while in the preoperative area.   She was taken to the operating room and placed under general anesthesia without difficulty.The abdomen and perineum were prepped and draped in a sterile manner, and she was placed in a dorsal supine position.  A Foley catheter was inserted into the bladder and attached to constant drainage. After an adequate timeout was performed, a transverse skin incision was made. This incision was taken down to the fascia using electrocautery with care given to maintain good hemostasis. The fascia was incised in the midline and the fascial incision was then extended bilaterally using electrocautery without difficulty. The fascia was then dissected off the underlying rectus muscles using blunt and sharp dissection. The rectus muscles were split bluntly in the midline and the peritoneum entered sharply without complication. This peritoneal incision was then extended superiorly and inferiorly with care given to prevent bowel or bladder injury. Attention was then turned to the pelvis. In order to access the uterus careful dissection was undertaken with the Metzenbaum scissors to release the adhesions of bowel, omentum and the bladder so that the uterus could be freed up to be mobilized. The uterus at this point was noted to be mobilized and was delivered up out of the abdomen.  The bowel was packed away with moist laparotomy sponges.  The round ligaments on each side were clamped, suture ligated with 0 Vicryl, and transected with electrocautery allowing entry into the broad ligament. Of note, all sutures used in this procedure  are 0 Vicryl unless otherwise noted. The anterior and posterior leaves of the broad ligament were separated, and the ureters were inspected to be safely away from the area of dissection bilaterally.  The IP ligament was e clamped on the patient's left side, cut, and doubly suture ligated. On the right side the fallopian tube was removed leaving the ovary in place. Kelly clamps were placed on the mesosalpinx of the right fallopian tube, and the fallopian tube was excised.   A bladder flap was created while releasing the adhesions. The bladder was then bluntly dissected off the lower uterine segment and cervix with good hemostasis noted. The uterine arteries were then skeletonized bilaterally and then clamped, cut, and doubly suture ligated with care given to prevent ureteral injury.  There was sigmoid colon attached to the post of the cervix and given the risk of injury, a decision was made to leave the cervix in place. The uterus was then amputated across the lower uterine segment leaving the cervix intact. The specimen was sent to pathology. The endocervix was resected using a scalpel and then the cervical canal was coagulated, and the anterior and posterior peritoneal edges were then reapproximated in the midline over the cervical stump without complication.   The pelvis was irrigated and hemostasis was reconfirmed at all pedicles and along the pelvic sidewall.  The ureters were inspected and noted to be peristalsing bilaterally.  All laparotomy sponges and instruments were removed from the abdomen. The peritoneum was closed with a running stitch, and the fascia was also closed in a running fashion. The subcutaneous layer was reapproximated with 3-0 vicryl. The skin was closed with a 4-0 Vicryl subcuticular stitch. Sponge, lap, needle, and instrument counts were correct times two. The patient was taken to the recovery area awake, extubated and in stable condition.  An experienced assistant was required given the  standard of surgical care given the complexity of the case.  This assistant was needed for exposure, dissection, suctioning, retraction, instrument exchange  and for overall help during the procedure.   Xavi Tomasik L. Ihor Dow, M.D., [facility] 01/29/2021 4:49 PM

## 2021-01-29 NOTE — Anesthesia Procedure Notes (Signed)
Procedure Name: Intubation Date/Time: 01/29/2021 2:04 PM Performed by: Wilburn Cornelia, CRNA Pre-anesthesia Checklist: Patient identified, Emergency Drugs available, Suction available, Patient being monitored and Timeout performed Patient Re-evaluated:Patient Re-evaluated prior to induction Oxygen Delivery Method: Circle system utilized Preoxygenation: Pre-oxygenation with 100% oxygen Induction Type: IV induction Ventilation: Mask ventilation without difficulty Laryngoscope Size: Mac and 3 Grade View: Grade II Tube type: Oral Tube size: 7.0 mm Number of attempts: 1 Airway Equipment and Method: Stylet Placement Confirmation: ETT inserted through vocal cords under direct vision, positive ETCO2, CO2 detector and breath sounds checked- equal and bilateral Secured at: 21 cm Tube secured with: Tape Dental Injury: Teeth and Oropharynx as per pre-operative assessment

## 2021-01-29 NOTE — Brief Op Note (Signed)
01/29/2021  4:43 PM  PATIENT:  Peggy Payne  50 y.o. female  PRE-OPERATIVE DIAGNOSIS:  Uterine fibroids  POST-OPERATIVE DIAGNOSIS:  Uterine fibroids  PROCEDURE:  Procedure(s) with comments: SUPRACERVICAL ABDOMINAL HYSTERECTOMY  WITH BILATERAL SALPINGO AND LEFT OOPHORECTOMY (Bilateral) - TAP Block  SURGEON:  Surgeon(s) and Role:    * Lavonia Drafts, MD - Primary    * Megan Salon, MD - Assisting  ANESTHESIA:   general and TAP block   EBL:  350 mL   BLOOD ADMINISTERED:none  DRAINS: none   LOCAL MEDICATIONS USED:  MARCAINE     SPECIMEN:  Source of Specimen:  uterus, left ovary and bilateral fallopian tubes.   DISPOSITION OF SPECIMEN:  PATHOLOGY  COUNTS:  YES  TOURNIQUET:  * No tourniquets in log *  DICTATION: .Note written in EPIC  PLAN OF CARE: Admit to inpatient   PATIENT DISPOSITION:  PACU - hemodynamically stable.   Delay start of Pharmacological VTE agent (>24hrs) due to surgical blood loss or risk of bleeding: yes  Complications: none immediate  Peggy Payne, M.D., [facility]

## 2021-01-29 NOTE — Anesthesia Preprocedure Evaluation (Addendum)
Anesthesia Evaluation  Patient identified by MRN, date of birth, ID band Patient awake    Reviewed: Allergy & Precautions, NPO status , Patient's Chart, lab work & pertinent test results  Airway Mallampati: II  TM Distance: >3 FB Neck ROM: Full    Dental  (+) Dental Advisory Given, Teeth Intact   Pulmonary neg pulmonary ROS, Current Smoker and Patient abstained from smoking.,    Pulmonary exam normal breath sounds clear to auscultation       Cardiovascular hypertension, Pt. on medications Normal cardiovascular exam Rhythm:Regular Rate:Normal     Neuro/Psych negative neurological ROS  negative psych ROS   GI/Hepatic negative GI ROS, Neg liver ROS,   Endo/Other  negative endocrine ROS  Renal/GU negative Renal ROS   Fibroids     Musculoskeletal negative musculoskeletal ROS (+)   Abdominal   Peds  Hematology negative hematology ROS (+) anemia ,   Anesthesia Other Findings   Reproductive/Obstetrics                          Anesthesia Physical Anesthesia Plan  ASA: 2  Anesthesia Plan: General   Post-op Pain Management: Regional block, Gabapentin PO (pre-op) and Ofirmev IV (intra-op)   Induction: Intravenous  PONV Risk Score and Plan: 4 or greater and Ondansetron, Dexamethasone, Midazolam, Treatment may vary due to age or medical condition and Diphenhydramine  Airway Management Planned: Oral ETT  Additional Equipment: None  Intra-op Plan:   Post-operative Plan: Extubation in OR  Informed Consent: I have reviewed the patients History and Physical, chart, labs and discussed the procedure including the risks, benefits and alternatives for the proposed anesthesia with the patient or authorized representative who has indicated his/her understanding and acceptance.     Dental advisory given  Plan Discussed with: CRNA  Anesthesia Plan Comments: (Lab Results      Component                 Value               Date                      WBC                      5.9                 01/29/2021                HGB                      10.2 (L)            01/29/2021                HCT                      35.4 (L)            01/29/2021                MCV                      71.7 (L)            01/29/2021                PLT  451 (H)             01/29/2021          )      Anesthesia Quick Evaluation

## 2021-01-29 NOTE — Transfer of Care (Signed)
Immediate Anesthesia Transfer of Care Note  Patient: Peggy Payne  Procedure(s) Performed: SUPRACERVICAL ABDOMINAL HYSTERECTOMY  WITH BILATERAL SALPINGO AND LEFT OOPHORECTOMY (Bilateral)  Patient Location: PACU  Anesthesia Type:General and Regional  Level of Consciousness: drowsy  Airway & Oxygen Therapy: Patient Spontanous Breathing  Post-op Assessment: Report given to RN and Post -op Vital signs reviewed and stable  Post vital signs: Reviewed and stable  Last Vitals:  Vitals Value Taken Time  BP    Temp    Pulse    Resp    SpO2      Last Pain:  Vitals:   01/29/21 1137  TempSrc:   PainSc: 0-No pain         Complications: No notable events documented.

## 2021-01-29 NOTE — Anesthesia Procedure Notes (Addendum)
Anesthesia Regional Block: TAP block   Pre-Anesthetic Checklist: , timeout performed,  Correct Patient, Correct Site, Correct Laterality,  Correct Procedure, Correct Position, site marked,  Risks and benefits discussed,  Surgical consent,  Pre-op evaluation,  At surgeon's request and post-op pain management  Laterality: Left and Right  Prep: chloraprep       Needles:  Injection technique: Single-shot  Needle Type: Echogenic Stimulator Needle      Needle Gauge: 21     Additional Needles:   Procedures:,,,, ultrasound used (permanent image in chart),,    Narrative:  Start time: 01/29/2021 12:50 PM End time: 01/29/2021 1:10 PM Injection made incrementally with aspirations every 5 mL.  Performed by: Personally  Anesthesiologist: Nolon Nations, MD  Additional Notes: BP cuff, SpO2 and EKG monitors applied. Sedation begun.  Anesthetic injected incrementally, slowly, and after neg aspirations under direct ultrasound guidance. Good fascial spread noted. Patient tolerated well.

## 2021-01-30 DIAGNOSIS — D62 Acute posthemorrhagic anemia: Secondary | ICD-10-CM

## 2021-01-30 LAB — CBC
HCT: 25.9 % — ABNORMAL LOW (ref 36.0–46.0)
Hemoglobin: 7.7 g/dL — ABNORMAL LOW (ref 12.0–15.0)
MCH: 20.9 pg — ABNORMAL LOW (ref 26.0–34.0)
MCHC: 29.7 g/dL — ABNORMAL LOW (ref 30.0–36.0)
MCV: 70.4 fL — ABNORMAL LOW (ref 80.0–100.0)
Platelets: 361 10*3/uL (ref 150–400)
RBC: 3.68 MIL/uL — ABNORMAL LOW (ref 3.87–5.11)
RDW: 17.8 % — ABNORMAL HIGH (ref 11.5–15.5)
WBC: 16.4 10*3/uL — ABNORMAL HIGH (ref 4.0–10.5)
nRBC: 0 % (ref 0.0–0.2)

## 2021-01-30 NOTE — Progress Notes (Signed)
Mobility Specialist Progress Note:   01/30/21 1545  Mobility  Activity Ambulated in hall  Level of Assistance Contact guard assist, steadying assist  Assistive Device None  Distance Ambulated (ft) 570 ft  Mobility Ambulated with assistance in hallway  Mobility Response Tolerated well  Mobility performed by Mobility specialist  $Mobility charge 1 Mobility   Second mobility session today. Pt required a HHA for bed mobility, Independent to stand. No AD used, contact G required for steadiness. Pt c/o 5/10 abdominal pain after receiving medication. Pt back in bed with all needs met.   Nelta Numbers Mobility Specialist  Phone [phone]

## 2021-01-30 NOTE — Anesthesia Postprocedure Evaluation (Signed)
Anesthesia Post Note  Patient: Peggy Payne  Procedure(s) Performed: SUPRACERVICAL ABDOMINAL HYSTERECTOMY  WITH BILATERAL SALPINGO AND LEFT OOPHORECTOMY (Bilateral)     Patient location during evaluation: PACU Anesthesia Type: General Level of consciousness: sedated and patient cooperative Pain management: pain level controlled Vital Signs Assessment: post-procedure vital signs reviewed and stable Respiratory status: spontaneous breathing Cardiovascular status: stable Anesthetic complications: no   No notable events documented.  Last Vitals:  Vitals:   01/30/21 0513 01/30/21 0721  BP: 128/62 123/66  Pulse: 83 76  Resp: 18 16  Temp: 36.9 C 37.4 C  SpO2: 100% 100%    Last Pain:  Vitals:   01/30/21 0721  TempSrc: Oral  PainSc:                  Nolon Nations

## 2021-01-30 NOTE — Progress Notes (Signed)
Pt medicated for pain pre ambulating. Abdominal binder applied as ordered . Pt getting ready to go walking with mobility tech

## 2021-01-30 NOTE — Progress Notes (Signed)
1 Day Post-Op Procedure(s) (LRB): SUPRACERVICAL ABDOMINAL HYSTERECTOMY  WITH BILATERAL SALPINGO AND LEFT OOPHORECTOMY (Bilateral)  Subjective: Patient reports tolerating PO and + flatus.    Objective: I have reviewed patient's vital signs, intake and output, medications, and pathology. BP 123/66 (BP Location: Left Arm)   Pulse 76   Temp 99.4 F (37.4 C) (Oral)   Resp 16   Ht 5\' 6"  (1.676 m)   Wt 83 kg   LMP 01/26/2021 (Exact Date)   SpO2 100%   BMI 29.54 kg/m    I/O last 3 completed shifts: In: 2340 [P.O.:240; I.V.:2000; IV Piggyback:100] Out: 2900 [Urine:2550; Blood:350] No intake/output data recorded.   General: alert and no distress Resp: clear to auscultation bilaterally Cardio: regular rate and rhythm, S1, S2 normal, no murmur, click, rub or gallop Vaginal Bleeding: none Abd: incision- dressing in place. Dry.    CBC Latest Ref Rng & Units 01/30/2021 01/29/2021 08/22/2020  WBC 4.0 - 10.5 K/uL 16.4(H) 5.9 5.8  Hemoglobin 12.0 - 15.0 g/dL 7.7(L) 10.2(L) 8.6(L)  Hematocrit 36.0 - 46.0 % 25.9(L) 35.4(L) 30.6(L)  Platelets 150 - 400 K/uL 361 451(H) 589(H)    Assessment: s/p Procedure(s) with comments: SUPRACERVICAL ABDOMINAL HYSTERECTOMY  WITH BILATERAL SALPINGO AND LEFT OOPHORECTOMY (Bilateral) - TAP Block: stable, progressing well, tolerating diet, and anemia  Plan: Encourage ambulation Needs to void.  Anticipate discharge later today once ambulating.    LOS: 1 day    Lavonia Drafts 01/30/2021, 7:44 AM

## 2021-01-30 NOTE — Progress Notes (Signed)
Pt ambulated a full lap on unit this afternoon, activity, gait and posture much more improved

## 2021-01-30 NOTE — Progress Notes (Signed)
Mobility Specialist Progress Note:   01/30/21 1120  Mobility  Activity Ambulated in hall  Level of Assistance Modified independent, requires aide device or extra time  Assistive Device Front wheel walker  Distance Ambulated (ft) 290 ft  Mobility Out of bed for toileting;Ambulated independently in hallway  Mobility Response Tolerated fair  Mobility performed by Mobility specialist  $Mobility charge 1 Mobility   Pt with 6/10 pain before, during and after ambulation. Distance limited secondary to pain. Pt to BR to void after ambulation. Pt back in bed eating lunch.   Nelta Numbers Mobility Specialist  Phone [phone]

## 2021-01-31 ENCOUNTER — Encounter (HOSPITAL_COMMUNITY): Payer: Self-pay | Admitting: Obstetrics & Gynecology

## 2021-01-31 LAB — CBC WITH DIFFERENTIAL/PLATELET
Abs Immature Granulocytes: 0.04 10*3/uL (ref 0.00–0.07)
Basophils Absolute: 0 10*3/uL (ref 0.0–0.1)
Basophils Relative: 0 %
Eosinophils Absolute: 0 10*3/uL (ref 0.0–0.5)
Eosinophils Relative: 0 %
HCT: 21.3 % — ABNORMAL LOW (ref 36.0–46.0)
Hemoglobin: 6.4 g/dL — CL (ref 12.0–15.0)
Immature Granulocytes: 0 %
Lymphocytes Relative: 15 %
Lymphs Abs: 1.7 10*3/uL (ref 0.7–4.0)
MCH: 21.1 pg — ABNORMAL LOW (ref 26.0–34.0)
MCHC: 30 g/dL (ref 30.0–36.0)
MCV: 70.1 fL — ABNORMAL LOW (ref 80.0–100.0)
Monocytes Absolute: 1.2 10*3/uL — ABNORMAL HIGH (ref 0.1–1.0)
Monocytes Relative: 10 %
Neutro Abs: 8.5 10*3/uL — ABNORMAL HIGH (ref 1.7–7.7)
Neutrophils Relative %: 75 %
Platelets: 309 10*3/uL (ref 150–400)
RBC: 3.04 MIL/uL — ABNORMAL LOW (ref 3.87–5.11)
RDW: 18 % — ABNORMAL HIGH (ref 11.5–15.5)
WBC: 11.5 10*3/uL — ABNORMAL HIGH (ref 4.0–10.5)
nRBC: 0 % (ref 0.0–0.2)

## 2021-01-31 MED ORDER — DOCUSATE SODIUM 100 MG PO CAPS: 100.0000 mg | ORAL_CAPSULE | Freq: Two times a day (BID) | ORAL | 0 refills | Status: DC

## 2021-01-31 MED ORDER — SODIUM CHLORIDE 0.9 % IV BOLUS: 500.0000 mL | Freq: Once | INTRAVENOUS | Status: DC | PRN

## 2021-01-31 MED ORDER — SODIUM CHLORIDE 0.9 % IV SOLN: INTRAVENOUS | Status: DC | PRN

## 2021-01-31 MED ORDER — OXYCODONE-ACETAMINOPHEN 5-325 MG PO TABS: 1.0000 | ORAL_TABLET | Freq: Four times a day (QID) | ORAL | 0 refills | Status: DC | PRN

## 2021-01-31 MED ORDER — SODIUM CHLORIDE 0.9 % IV SOLN
500.0000 mg | Freq: Once | INTRAVENOUS | Status: AC
  Administered 2021-01-31: 500 mg via INTRAVENOUS
  Filled 2021-01-31: qty 25

## 2021-01-31 MED ORDER — METHYLPREDNISOLONE SODIUM SUCC 125 MG IJ SOLR: 125.0000 mg | Freq: Once | INTRAMUSCULAR | Status: DC | PRN

## 2021-01-31 MED ORDER — ALBUTEROL SULFATE (2.5 MG/3ML) 0.083% IN NEBU: 2.5000 mg | INHALATION_SOLUTION | Freq: Once | RESPIRATORY_TRACT | Status: DC | PRN

## 2021-01-31 MED ORDER — IBUPROFEN 600 MG PO TABS: 600.0000 mg | ORAL_TABLET | Freq: Four times a day (QID) | ORAL | 0 refills | Status: DC

## 2021-01-31 MED ORDER — DIPHENHYDRAMINE HCL 50 MG/ML IJ SOLN: 25.0000 mg | Freq: Once | INTRAMUSCULAR | Status: DC | PRN

## 2021-01-31 MED ORDER — INTEGRA 62.5-62.5-40-3 MG PO CAPS: 1.0000 | ORAL_CAPSULE | Freq: Every day | ORAL | 3 refills | Status: DC

## 2021-01-31 MED ORDER — SODIUM CHLORIDE 0.9 % IV SOLN: 300.0000 mg | Freq: Once | INTRAVENOUS | Status: DC

## 2021-01-31 MED ORDER — EPINEPHRINE PF 1 MG/ML IJ SOLN: 0.3000 mg | Freq: Once | INTRAMUSCULAR | Status: DC | PRN

## 2021-01-31 NOTE — Progress Notes (Signed)
Mobility Specialist Progress Note:   01/31/21 1140  Mobility  Activity Ambulated in hall  Level of Assistance Independent  Assistive Device None  Distance Ambulated (ft) 560 ft  Mobility Ambulated independently in hallway  Mobility Response Tolerated well  Mobility performed by Mobility specialist  $Mobility charge 1 Mobility   Pt with minor RLQ pain during ambulation. Otherwise asx. Pt back in bed with all needs met.  Nelta Numbers Mobility Specialist  Phone [phone]

## 2021-01-31 NOTE — Progress Notes (Signed)
2 Days Post-Op Procedure(s) (LRB): SUPRACERVICAL ABDOMINAL HYSTERECTOMY  WITH BILATERAL SALPINGO AND LEFT OOPHORECTOMY (Bilateral)  Subjective: Patient reports incisional pain, tolerating PO, and no problems voiding.   Able to ambulate no c/o dizziness Objective: I have reviewed patient's vital signs, medications, and labs. Blood pressure (!) 132/58, pulse 81, temperature 99 F (37.2 C), temperature source Oral, resp. rate 16, height 5\' 6"  (1.676 m), weight 83 kg, last menstrual period 01/26/2021, SpO2 99 %.  General: alert, cooperative, and no distress Resp: effort normal GI: soft, non-tender; bowel sounds normal; no masses,  no organomegaly and incision: clean, dry, and intact CBC    Component Value Date/Time   WBC 11.5 (H) 01/31/2021 0107   RBC 3.04 (L) 01/31/2021 0107   HGB 6.4 (LL) 01/31/2021 0107   HCT 21.3 (L) 01/31/2021 0107   PLT 309 01/31/2021 0107   MCV 70.1 (L) 01/31/2021 0107   MCH 21.1 (L) 01/31/2021 0107   MCHC 30.0 01/31/2021 0107   RDW 18.0 (H) 01/31/2021 0107   LYMPHSABS 1.7 01/31/2021 0107   MONOABS 1.2 (H) 01/31/2021 0107   EOSABS 0.0 01/31/2021 0107   BASOSABS 0.0 01/31/2021 0107    Assessment: s/p Procedure(s) with comments: SUPRACERVICAL ABDOMINAL HYSTERECTOMY  WITH BILATERAL SALPINGO AND LEFT OOPHORECTOMY (Bilateral) - TAP Block: anemia Postoperative anemia Plan: I discussed her anemia and informed her she may need transfusion but she says she is not particularly symptomatic and will discuss this further with Dr. Ihor Dow. Her questions were answered  LOS: 2 days    Emeterio Reeve 01/31/2021, 2:16 AM

## 2021-01-31 NOTE — Progress Notes (Signed)
Critical lab of hemoglobin of 6.4 this morning. Pt asymptomatic, VSS, no bleeding noted. Made on call provider aware, Dr. Roselie Awkward. No new orders at this time.

## 2021-01-31 NOTE — Discharge Summary (Signed)
Physician Discharge Summary  Patient ID: JAHNIAH PALLAS MRN: [id number] DOB/AGE: Apr 21, 1970 50 y.o.  Admit date: 01/29/2021 Discharge date: 01/31/2021  Admission Diagnoses: Uterine fibroids Bilateral adnexal masses  Discharge Diagnoses:  Principal Problem:   Post-operative state Active Problems:   Pelvic pain in female   Iron deficiency anemia due to chronic blood loss   Bilateral ovarian cysts   Essential hypertension   Current smoker   Anemia associated with acute blood loss  Discharged Condition: good  Hospital Course: Patient had an uncomplicated surgery; for further details of this surgery, please refer to the operative note. Furthermore, the patient's post op course was complicated by asymptomatic acute blood loss anemia. Her post op course was otherwise uncomplicated. She was given an iron transfusion on HD #2. By time of discharge, her pain was controlled on oral pain medications; she was ambulating, voiding without difficulty, tolerating regular diet and passing flatus.  She was deemed stable for discharge to home.     Consults: None  Significant Diagnostic Studies: labs: CBC  Treatments: surgery: supracervical hysterectomy with LSO and right salpingectomy.  Discharge Exam: Blood pressure 124/69, pulse 80, temperature 98 F (36.7 C), temperature source Oral, resp. rate 18, height 5\' 6"  (1.676 m), weight 83 kg, last menstrual period 01/26/2021, SpO2 100 %. General appearance: alert and no distress Resp: clear to auscultation bilaterally Cardio: regular rate and rhythm, S1, S2 normal, no murmur, click, rub or gallop GI: soft, ND appropraitely tender post op. Dressing C/D/I  Extremities: extremities normal, atraumatic, no cyanosis or edema CBC Latest Ref Rng & Units 01/31/2021 01/30/2021 01/29/2021  WBC 4.0 - 10.5 K/uL 11.5(H) 16.4(H) 5.9  Hemoglobin 12.0 - 15.0 g/dL 6.4(LL) 7.7(L) 10.2(L)  Hematocrit 36.0 - 46.0 % 21.3(L) 25.9(L) 35.4(L)  Platelets 150 - 400 K/uL 309  361 451(H)    Disposition: Discharge disposition: 01-Home or Self Care       Discharge Instructions     Call MD for:  difficulty breathing, headache or visual disturbances   Complete by: As directed    Call MD for:  extreme fatigue   Complete by: As directed    Call MD for:  hives   Complete by: As directed    Call MD for:  persistant dizziness or light-headedness   Complete by: As directed    Call MD for:  persistant nausea and vomiting   Complete by: As directed    Call MD for:  redness, tenderness, or signs of infection (pain, swelling, redness, odor or green/yellow discharge around incision site)   Complete by: As directed    Call MD for:  severe uncontrolled pain   Complete by: As directed    Call MD for:  temperature >100.4   Complete by: As directed    Diet - low sodium heart healthy   Complete by: As directed    Discharge wound care:   Complete by: As directed    Remove out honeycomb dressing in 1 week.   Driving Restrictions   Complete by: As directed    No driving for 2 weeks.   Increase activity slowly   Complete by: As directed    Lifting restrictions   Complete by: As directed    NO heavy lifting for 6 weeks.   Sexual Activity Restrictions   Complete by: As directed    Nothing in vagina for 6 weeks.      Allergies as of 01/31/2021   No Known Allergies      Medication List  STOP taking these medications    ferrous sulfate 325 (65 FE) MG tablet Commonly known as: FerrouSul       TAKE these medications    amLODipine 5 MG tablet Commonly known as: NORVASC Take 1 tablet (5 mg total) by mouth daily.   docusate sodium 100 MG capsule Commonly known as: COLACE Take 1 capsule (100 mg total) by mouth 2 (two) times daily.   ibuprofen 600 MG tablet Commonly known as: ADVIL Take 1 tablet (600 mg total) by mouth every 6 (six) hours.   Integra 62.5-62.5-40-3 MG Caps Take 1 capsule by mouth daily.   oxyCODONE-acetaminophen 5-325 MG  tablet Commonly known as: PERCOCET/ROXICET Take 1 tablet by mouth every 6 (six) hours as needed for moderate pain.               Discharge Care Instructions  (From admission, onward)           Start     Ordered   01/31/21 0000  Discharge wound care:       Comments: Remove out honeycomb dressing in 1 week.   01/31/21 1111            [facility] Follow up in 2 week(s).   Specialty: Obstetrics and Gynecology Contact information: [place] [id number] [phone]                Signed: Lavonia Drafts 01/31/2021, 11:13 AM

## 2021-01-31 NOTE — Progress Notes (Signed)
2 Days Post-Op Procedure(s) (LRB): SUPRACERVICAL ABDOMINAL HYSTERECTOMY  WITH BILATERAL SALPINGO AND LEFT OOPHORECTOMY (Bilateral)  Subjective: Patient reports tolerating PO, + flatus, and no problems voiding.  Pt denies dizziness with ambulation but, she does reports feeling slightly lightheaded. She denies DOE or any SOB.   Her pain is well controlled.      Objective: I have reviewed patient's vital signs, intake and output, medications, and labs. BP 124/69 (BP Location: Right Arm)   Pulse 80   Temp 98 F (36.7 C) (Oral)   Resp 18   Ht 5\' 6"  (1.676 m)   Wt 83 kg   LMP 01/26/2021 (Exact Date)   SpO2 100%   BMI 29.54 kg/m  I/O last 3 completed shifts: In: 240 [P.O.:240] Out: 1900 [Urine:1900] Total I/O In: 240 [P.O.:240] Out: -    General: alert and no distress Resp: clear to auscultation bilaterally Cardio: regular rate and rhythm, S1, S2 normal, no murmur, click, rub or gallop GI: incision: clean, dry, and intact Vaginal Bleeding: none Abd: + BS, appropriately tender post op.   CBC Latest Ref Rng & Units 01/31/2021 01/30/2021 01/29/2021  WBC 4.0 - 10.5 K/uL 11.5(H) 16.4(H) 5.9  Hemoglobin 12.0 - 15.0 g/dL 6.4(LL) 7.7(L) 10.2(L)  Hematocrit 36.0 - 46.0 % 21.3(L) 25.9(L) 35.4(L)  Platelets 150 - 400 K/uL 309 361 451(H)    Assessment: s/p Procedure(s) with comments: SUPRACERVICAL ABDOMINAL HYSTERECTOMY  WITH BILATERAL SALPINGO AND LEFT OOPHORECTOMY (Bilateral) - TAP Block: stable, progressing well, tolerating diet, and anemia I discussed with pt blood transfusion vs iron transfusion. Pt declines transfusion. She reports that she will not smoke as I have informed her that this increases her risks of complications post surgery.    Plan: Encourage ambulation Discharge home IV iron transfusion prior to discharge.   LOS: 2 days    Lavonia Drafts 01/31/2021, 10:45 AM

## 2021-02-01 LAB — SURGICAL PATHOLOGY

## 2021-02-02 ENCOUNTER — Other Ambulatory Visit: Payer: Self-pay

## 2021-02-02 ENCOUNTER — Ambulatory Visit
Admission: EM | Admit: 2021-02-02 | Discharge: 2021-02-02 | Disposition: A | Discharge status: Home / Self Care | Payer: 59

## 2021-02-02 ENCOUNTER — Observation Stay (HOSPITAL_COMMUNITY)
Admission: EM | Admit: 2021-02-02 | Discharge: 2021-02-04 | Disposition: A | Discharge status: Home / Self Care | Payer: 59 | Attending: Internal Medicine | Admitting: Internal Medicine

## 2021-02-02 ENCOUNTER — Emergency Department (HOSPITAL_COMMUNITY): Payer: 59

## 2021-02-02 ENCOUNTER — Encounter (HOSPITAL_COMMUNITY): Payer: Self-pay

## 2021-02-02 DIAGNOSIS — M7989 Other specified soft tissue disorders: Secondary | ICD-10-CM | POA: Diagnosis present

## 2021-02-02 DIAGNOSIS — L03114 Cellulitis of left upper limb: Secondary | ICD-10-CM | POA: Insufficient documentation

## 2021-02-02 DIAGNOSIS — R079 Chest pain, unspecified: Secondary | ICD-10-CM | POA: Insufficient documentation

## 2021-02-02 DIAGNOSIS — R0602 Shortness of breath: Secondary | ICD-10-CM

## 2021-02-02 DIAGNOSIS — D649 Anemia, unspecified: Secondary | ICD-10-CM | POA: Diagnosis not present

## 2021-02-02 DIAGNOSIS — Z79899 Other long term (current) drug therapy: Secondary | ICD-10-CM | POA: Insufficient documentation

## 2021-02-02 DIAGNOSIS — Z20822 Contact with and (suspected) exposure to covid-19: Secondary | ICD-10-CM | POA: Insufficient documentation

## 2021-02-02 DIAGNOSIS — I10 Essential (primary) hypertension: Secondary | ICD-10-CM | POA: Diagnosis not present

## 2021-02-02 DIAGNOSIS — E079 Disorder of thyroid, unspecified: Secondary | ICD-10-CM | POA: Diagnosis present

## 2021-02-02 DIAGNOSIS — F17210 Nicotine dependence, cigarettes, uncomplicated: Secondary | ICD-10-CM | POA: Diagnosis not present

## 2021-02-02 DIAGNOSIS — T801XXA Vascular complications following infusion, transfusion and therapeutic injection, initial encounter: Secondary | ICD-10-CM

## 2021-02-02 LAB — COMPREHENSIVE METABOLIC PANEL
ALT: 21 U/L (ref 0–44)
AST: 28 U/L (ref 15–41)
Albumin: 4 g/dL (ref 3.5–5.0)
Alkaline Phosphatase: 64 U/L (ref 38–126)
Anion gap: 10 (ref 5–15)
BUN: 18 mg/dL (ref 6–20)
CO2: 24 mmol/L (ref 22–32)
Calcium: 9.3 mg/dL (ref 8.9–10.3)
Chloride: 102 mmol/L (ref 98–111)
Creatinine, Ser: 0.79 mg/dL (ref 0.44–1.00)
GFR, Estimated: >60 mL/min (ref >60)
Glucose, Bld: 121 mg/dL — ABNORMAL HIGH (ref 70–99)
Potassium: 3.6 mmol/L (ref 3.5–5.1)
Sodium: 136 mmol/L (ref 135–145)
Total Bilirubin: 0.5 mg/dL (ref 0.3–1.2)
Total Protein: 7.5 g/dL (ref 6.5–8.1)

## 2021-02-02 LAB — TROPONIN I (HIGH SENSITIVITY)
Troponin I (High Sensitivity): 3 ng/L (ref <18)
Troponin I (High Sensitivity): 4 ng/L (ref <18)

## 2021-02-02 LAB — D-DIMER, QUANTITATIVE: D-Dimer, Quant: 1.76 ug/mL-FEU — ABNORMAL HIGH (ref 0.00–0.50)

## 2021-02-02 LAB — PROTIME-INR
INR: 0.9 (ref 0.8–1.2)
Prothrombin Time: 12.4 seconds (ref 11.4–15.2)

## 2021-02-02 MED ORDER — IOHEXOL 350 MG/ML SOLN
75.0000 mL | Freq: Once | INTRAVENOUS | Status: AC | PRN
  Administered 2021-02-02: 75 mL via INTRAVENOUS

## 2021-02-02 MED ORDER — CEFAZOLIN SODIUM-DEXTROSE 1-4 GM/50ML-% IV SOLN
1.0000 g | Freq: Once | INTRAVENOUS | Status: AC
  Administered 2021-02-02: 1 g via INTRAVENOUS
  Filled 2021-02-02: qty 50

## 2021-02-02 NOTE — ED Provider Notes (Signed)
[facility]    CSN: [id number] Arrival date & time: 02/02/21  1438      History   Chief Complaint Chief Complaint  Patient presents with   extravasation    HPI Peggy Payne is a 50 y.o. female.   Patient presents to the urgent care due to left hand swelling and pain that started on 01/29/2021 after an IV infiltrated during her surgical procedure.  Patient reports that she had a hysterectomy on 01/29/2021, and her hemoglobin subsequently dropped after this procedure.  Was given an iron infusion to help treat this when her IV infiltrated and caused the hand swelling.  Patient reports that hand swelling has not improved and hand pain has become more severe.  Patient also reports numbness of her left hand.  Also endorses some decreased range of motion.  Patient also reports that she has had constant shortness of breath that has worsened over the past few days since procedure occurred.  Also reports a low-grade fever of 99 that she noticed yesterday.  Denies any chest pain.  Has been using incentive spirometer with no improvement in shortness of breath.    Past Medical History:  Diagnosis Date   Anemia    Fibroid    Hypertension     Patient Active Problem List   Diagnosis Date Noted   Anemia associated with acute blood loss 01/30/2021   Post-operative state 01/29/2021   Essential hypertension 01/23/2021   Current smoker 01/23/2021   Fibroids 01/23/2021   Bilateral ovarian cysts 01/21/2021   Submucous uterine fibroid 01/04/2021   Pelvic pain in female 01/04/2021   Iron deficiency anemia due to chronic blood loss 01/04/2021    Past Surgical History:  Procedure Laterality Date   HYSTERECTOMY ABDOMINAL WITH SALPINGO-OOPHORECTOMY Bilateral 01/29/2021   Procedure: SUPRACERVICAL ABDOMINAL HYSTERECTOMY  WITH BILATERAL SALPINGO AND LEFT OOPHORECTOMY;  Surgeon: Lavonia Drafts, MD;  Location: [facility];  Service: Gynecology;  Laterality: Bilateral;  TAP Block    MYOMECTOMY  03/03/2009   WISDOM TOOTH EXTRACTION      OB History     Gravida  1   Para      Term      Preterm      AB  1   Living         SAB      IAB  1   Ectopic      Multiple      Live Births               Home Medications    Prior to Admission medications   Medication Sig Start Date End Date Taking? Authorizing Provider  amLODipine (NORVASC) 5 MG tablet Take 1 tablet (5 mg total) by mouth daily. 01/23/21   Horald Pollen, MD  docusate sodium (COLACE) 100 MG capsule Take 1 capsule (100 mg total) by mouth 2 (two) times daily. 01/31/21   Lavonia Drafts, MD  Fe Fum-FePoly-Vit C-Vit B3 (INTEGRA) 62.5-62.5-40-3 MG CAPS Take 1 capsule by mouth daily. 01/31/21   Lavonia Drafts, MD  ibuprofen (ADVIL) 600 MG tablet Take 1 tablet (600 mg total) by mouth every 6 (six) hours. 01/31/21   Lavonia Drafts, MD  oxyCODONE-acetaminophen (PERCOCET/ROXICET) 5-325 MG tablet Take 1 tablet by mouth every 6 (six) hours as needed for moderate pain. 01/31/21   Lavonia Drafts, MD    Family History Family History  Problem Relation Age of Onset   Hypertension Mother    Cancer Father     Social History  Social History   Tobacco Use   Smoking status: Every Day    Packs/day: 0.25    Years: 30.00    Pack years: 7.50    Types: Cigarettes   Smokeless tobacco: Never  Vaping Use   Vaping Use: Never used  Substance Use Topics   Alcohol use: Yes    Comment: occasionally   Drug use: Never     Allergies   Patient has no known allergies.   Review of Systems Review of Systems Per HPI  Physical Exam Triage Vital Signs ED Triage Vitals [02/02/21 1509]  Enc Vitals Group     BP (!) 165/87     Pulse Rate 95     Resp 18     Temp 98.3 F (36.8 C)     Temp Source Oral     SpO2 100 %     Weight      Height      Head Circumference      Peak Flow      Pain Score 7     Pain Loc      Pain Edu?      Excl. in [place]?    No data  found.  Updated Vital Signs BP (!) 165/87 (BP Location: Right Arm)   Pulse 95   Temp 98.3 F (36.8 C) (Oral)   Resp 18   LMP 01/26/2021 (Exact Date)   SpO2 100%   Visual Acuity Right Eye Distance:   Left Eye Distance:   Bilateral Distance:    Right Eye Near:   Left Eye Near:    Bilateral Near:     Physical Exam Constitutional:      General: She is not in acute distress.    Appearance: Normal appearance. She is not toxic-appearing or diaphoretic.  HENT:     Head: Normocephalic and atraumatic.  Eyes:     Extraocular Movements: Extraocular movements intact.     Conjunctiva/sclera: Conjunctivae normal.  Cardiovascular:     Rate and Rhythm: Normal rate and regular rhythm.     Pulses: Normal pulses.     Heart sounds: Normal heart sounds.  Pulmonary:     Breath sounds: Normal breath sounds.     Comments: Mild tachypnea present. Skin:    General: Skin is warm and dry.     Comments: Patient has moderate swelling of the dorsal surface of the left hand.  Neurovascular intact.  Limited range of motion due to swelling.  Tenderness to palpation to the dorsal surface of the left fourth metacarpal.  No erythema or bruising noted.  Neurological:     General: No focal deficit present.     Mental Status: She is alert and oriented to person, place, and time. Mental status is at baseline.  Psychiatric:        Mood and Affect: Mood normal.        Behavior: Behavior normal.        Thought Content: Thought content normal.        Judgment: Judgment normal.     UC Treatments / Results  Labs (all labs ordered are listed, but only abnormal results are displayed) Labs Reviewed - No data to display  EKG   Radiology No results found.  Procedures Procedures (including critical care time)  Medications Ordered in UC Medications - No data to display  Initial Impression / Assessment and Plan / UC Course  I have reviewed the triage vital signs and the nursing notes.  Pertinent labs &  imaging results that were available during my care of the patient were reviewed by me and considered in my medical decision making (see chart for details).     It appears the patient is having several complications following surgical procedure.  Patient will need to go to the hospital for further evaluation and management of shortness of breath and IV infiltration as she will need further work-up that cannot be provided at the urgent care.  Patient was agreeable with plan.  Vital signs stable at discharge and patient was not in respiratory distress.  Agree with patient's family member transporting her to the hospital. Final Clinical Impressions(s) / UC Diagnoses   Final diagnoses:  Shortness of breath  IV infiltrate, initial encounter     Discharge Instructions      Please go to the hospital soon as you leave urgent care for further evaluation and management.    ED Prescriptions   None    PDMP not reviewed this encounter.   Teodora Medici, [place] 02/02/21 [phone]

## 2021-02-02 NOTE — Discharge Instructions (Signed)
Please go to the hospital soon as you leave urgent care for further evaluation and management. °

## 2021-02-02 NOTE — ED Triage Notes (Signed)
Pt seen at [facility] today and sent to Ed. Pt c/o IV extravasation to her left hand while getting an iron infusion last Thursday.   PT reports the swelling has not improved. Hand is cold to the touch and cap refill is greater than two seconds. Pulses and sensation are present.   She also reports sob since last night. Denies chest pain

## 2021-02-02 NOTE — ED Triage Notes (Signed)
Pt c/o left extravasation 2/2 iron infusion last Thursday. States had hysterectomy last Tuesday and Hgb decreased in hospital. Iron infusion was administered and hand became edematous. IV switched to another site and pt discharged with instructions that edema will decrease. Edema has not decreased thus far. Pt states her hand is "tight" and is concerned the edema is not resolving on its own.

## 2021-02-02 NOTE — ED Notes (Signed)
This RN called lab about CBC not being resulted yet. Lab states another lavender top tube needed to be collected. Tube collected and sent down.

## 2021-02-02 NOTE — ED Provider Notes (Signed)
[place] DEPT Provider Note   CSN: [id number] Arrival date & time: 02/02/21  1633     History Chief Complaint  Patient presents with   Post-op Problem    Peggy Payne is a 50 y.o. female.  HPI  50 year old female presenting to the emergency department with chest pain and shortness of breath in addition to pain in her left upper extremity.  She has a history of uterine fibroids and is status post abdominal hysterectomy.  She endorses typical pain about her incision site with no significant redness, drainage.  Her IV infiltrated during iron administration during her last hospital admission and she had swelling about her left hand.  Since then, she has had worsening swelling up her left forearm with some redness.  She also endorses chills.  On chart review, the patient was discharged on 01/31/2021 after an abdominal hysterectomy that was uncomplicated during surgery but subsequently complicated by postoperative anemia.  She was undergoing iron transfusion on postoperative day 2 when her IV infiltrated.   She also endorses shortness of breath that has been worsening over the past few days.  She denies any chest pain.  She endorses persistent dyspnea at rest.  She describes it as "just a difficulty with feeling like I cannot catch my breath."   Past Medical History:  Diagnosis Date   Anemia    Fibroid    Hypertension     Patient Active Problem List   Diagnosis Date Noted   Symptomatic anemia 02/03/2021   Mass of thyroid gland 02/03/2021   Swelling of left hand 02/03/2021   Anemia associated with acute blood loss 01/30/2021   Post-operative state 01/29/2021   Essential hypertension 01/23/2021   Current smoker 01/23/2021   Fibroids 01/23/2021   Bilateral ovarian cysts 01/21/2021   Submucous uterine fibroid 01/04/2021   Pelvic pain in female 01/04/2021   Iron deficiency anemia due to chronic blood loss 01/04/2021    Past Surgical History:   Procedure Laterality Date   HYSTERECTOMY ABDOMINAL WITH SALPINGO-OOPHORECTOMY Bilateral 01/29/2021   Procedure: SUPRACERVICAL ABDOMINAL HYSTERECTOMY  WITH BILATERAL SALPINGO AND LEFT OOPHORECTOMY;  Surgeon: Lavonia Drafts, MD;  Location: [facility];  Service: Gynecology;  Laterality: Bilateral;  TAP Block   MYOMECTOMY  03/03/2009   WISDOM TOOTH EXTRACTION       OB History     Gravida  1   Para      Term      Preterm      AB  1   Living         SAB      IAB  1   Ectopic      Multiple      Live Births              Family History  Problem Relation Age of Onset   Hypertension Mother    Cancer Father     Social History   Tobacco Use   Smoking status: Every Day    Packs/day: 0.25    Years: 30.00    Pack years: 7.50    Types: Cigarettes   Smokeless tobacco: Never  Vaping Use   Vaping Use: Never used  Substance Use Topics   Alcohol use: Yes    Comment: occasionally   Drug use: Never    Home Medications Prior to Admission medications   Medication Sig Start Date End Date Taking? Authorizing Provider  amLODipine (NORVASC) 5 MG tablet Take 1 tablet (5 mg total) by mouth  daily. 01/23/21  Yes Sagardia, Ines Bloomer, MD  docusate sodium (COLACE) 100 MG capsule Take 1 capsule (100 mg total) by mouth 2 (two) times daily. 01/31/21  Yes Lavonia Drafts, MD  ibuprofen (ADVIL) 600 MG tablet Take 1 tablet (600 mg total) by mouth every 6 (six) hours. 01/31/21  Yes Lavonia Drafts, MD  oxyCODONE-acetaminophen (PERCOCET/ROXICET) 5-325 MG tablet Take 1 tablet by mouth every 6 (six) hours as needed for moderate pain. 01/31/21  Yes Lavonia Drafts, MD  Fe Fum-FePoly-Vit C-Vit B3 (INTEGRA) 62.5-62.5-40-3 MG CAPS Take 1 capsule by mouth daily. 01/31/21   Lavonia Drafts, MD    Allergies    Patient has no known allergies.  Review of Systems   Review of Systems  Constitutional:  Negative for chills and fever.  HENT:  Negative for ear pain  and sore throat.   Eyes:  Negative for pain and visual disturbance.  Respiratory:  Positive for shortness of breath. Negative for cough.   Cardiovascular:  Negative for chest pain and palpitations.  Gastrointestinal:  Negative for abdominal pain and vomiting.  Genitourinary:  Negative for dysuria and hematuria.  Musculoskeletal:  Negative for arthralgias and back pain.  Skin:  Positive for rash. Negative for color change.  Neurological:  Negative for seizures and syncope.  All other systems reviewed and are negative.  Physical Exam Updated Vital Signs BP 133/82 (BP Location: Right Arm)   Pulse 88   Temp 97.9 F (36.6 C) (Oral)   Resp 18   LMP 01/26/2021 (Exact Date)   SpO2 96%   Physical Exam Vitals and nursing note reviewed.  Constitutional:      General: She is not in acute distress.    Appearance: She is well-developed.  HENT:     Head: Normocephalic and atraumatic.  Eyes:     Conjunctiva/sclera: Conjunctivae normal.     Pupils: Pupils are equal, round, and reactive to light.  Cardiovascular:     Rate and Rhythm: Normal rate and regular rhythm.     Heart sounds: No murmur heard. Pulmonary:     Effort: Pulmonary effort is normal. No respiratory distress.     Breath sounds: Normal breath sounds.  Abdominal:     General: There is no distension.     Palpations: Abdomen is soft.     Tenderness: There is no abdominal tenderness. There is no guarding.  Musculoskeletal:        General: Swelling and tenderness present. No deformity or signs of injury.     Cervical back: Neck supple.     Comments: Left hand with swelling and mild tenderness, neurovascularly intact.  Skin:    General: Skin is warm and dry.     Capillary Refill: Capillary refill takes less than 2 seconds.     Findings: Erythema present. No lesion.     Comments: Left upper extremity with swelling from the patient's prior IV iron extravasation, erythema and warmth and mild tenderness along the left forearm  concerning for cellulitis  Neurological:     General: No focal deficit present.     Mental Status: She is alert. Mental status is at baseline.  Psychiatric:        Mood and Affect: Mood normal.    ED Results / Procedures / Treatments   Labs (all labs ordered are listed, but only abnormal results are displayed) Labs Reviewed  COMPREHENSIVE METABOLIC PANEL - Abnormal; Notable for the following components:      Result Value   Glucose, Bld 121 (*)  All other components within normal limits  D-DIMER, QUANTITATIVE (NOT AT [facility]) - Abnormal; Notable for the following components:   D-Dimer, Quant 1.76 (*)    All other components within normal limits  CBC WITH DIFFERENTIAL/PLATELET - Abnormal; Notable for the following components:   WBC 11.6 (*)    RBC 3.49 (*)    Hemoglobin 7.4 (*)    HCT 25.4 (*)    MCV 72.8 (*)    MCH 21.2 (*)    MCHC 29.1 (*)    RDW 19.3 (*)    Platelets 441 (*)    nRBC 0.7 (*)    Neutro Abs 8.9 (*)    Abs Immature Granulocytes 0.09 (*)    All other components within normal limits  BASIC METABOLIC PANEL - Abnormal; Notable for the following components:   Glucose, Bld 101 (*)    All other components within normal limits  RESP PANEL BY RT-PCR (FLU A&B, COVID) ARPGX2  PROTIME-INR  BRAIN NATRIURETIC PEPTIDE  CBC WITH DIFFERENTIAL/PLATELET  HIV ANTIBODY (ROUTINE TESTING W REFLEX)  TYPE AND SCREEN  PREPARE RBC (CROSSMATCH)  TROPONIN I (HIGH SENSITIVITY)  TROPONIN I (HIGH SENSITIVITY)    EKG EKG Interpretation  Date/Time:  Saturday February 02 2021 18:31:37 EST Ventricular Rate:  88 PR Interval:  144 QRS Duration: 75 QT Interval:  341 QTC Calculation: 413 R Axis:   43 Text Interpretation: Sinus rhythm Confirmed by Regan Lemming ([id number]) on 02/02/2021 6:43:48 PM  Radiology DG Chest 2 View  Result Date: 02/02/2021 CLINICAL DATA:  Shortness of breath, left hand extravasation after iron infusion 2 days ago. EXAM: CHEST - 2 VIEW COMPARISON:  None. FINDINGS:  The heart size and mediastinal contours are within normal limits. No focal consolidation. No pleural effusion. No pneumothorax. The visualized skeletal structures are unremarkable. IMPRESSION: No active cardiopulmonary disease. Electronically Signed   By: Dahlia Bailiff M.D.   On: 02/02/2021 18:30   CT Angio Chest PE W and/or Wo Contrast  Result Date: 02/02/2021 CLINICAL DATA:  Shortness of breath, positive D-dimer, suspected pulmonary embolism. EXAM: CT ANGIOGRAPHY CHEST WITH CONTRAST TECHNIQUE: Multidetector CT imaging of the chest was performed using the standard protocol during bolus administration of intravenous contrast. Multiplanar CT image reconstructions and MIPs were obtained to evaluate the vascular anatomy. CONTRAST:  58mL OMNIPAQUE IOHEXOL 350 MG/ML SOLN COMPARISON:  PA and lateral chest today, abdomen and pelvis CT 08/22/2020. No prior cross-sectional chest imaging. FINDINGS: Cardiovascular: There is mild panchamber cardiomegaly without findings of acute right heart strain. No pericardial effusion is seen. There are minimal calcifications in the left main and proximal LAD coronary arteries. There is no venous dilatation. Pulmonary arteries are normal in caliber without appreciable embolic filling defects. The aorta and branch vessels are normal. No plaques or dissection are noted and no aneurysm. Mediastinum/Nodes: There are no enlarged intrathoracic or axillary lymph nodes. There is a 2.8 cm heterogeneous mass in the right lobe of the thyroid gland and 2.1 cm heterogeneous mass in the left lobe. Thoracic esophagus is unremarkable as visualized. Lungs/Pleura: There is no pleural effusion, thickening or pneumothorax. There are mild paraseptal emphysematous changes in the lung apices. The lungs are clear. Upper Abdomen: No acute abnormality. Musculoskeletal: No chest wall abnormality. No acute or significant osseous findings. Review of the MIP images confirms the above findings. IMPRESSION: 1. No  acute chest CT or CTA findings. 2. Mild cardiomegaly, small amount of calcification left main and LAD coronary arteries. 3. Bilateral thyroid masses. Ultrasound routine follow-up recommended. 4. Early  paraseptal emphysematous change lung apices. Electronically Signed   By: Telford Nab M.D.   On: 02/02/2021 22:04    Procedures Procedures   Medications Ordered in ED Medications  oxyCODONE (Oxy IR/ROXICODONE) immediate release tablet 5-10 mg (10 mg Oral Given 02/03/21 0951)  senna-docusate (Senokot-S) tablet 1 tablet (has no administration in time range)  bisacodyl (DULCOLAX) EC tablet 5 mg (has no administration in time range)  ondansetron (ZOFRAN) tablet 4 mg (has no administration in time range)    Or  ondansetron (ZOFRAN) injection 4 mg (has no administration in time range)  acetaminophen (TYLENOL) tablet 650 mg (has no administration in time range)  iohexol (OMNIPAQUE) 350 MG/ML injection 75 mL (75 mLs Intravenous Contrast Given 02/02/21 2123)  ceFAZolin (ANCEF) IVPB 1 g/50 mL premix (0 g Intravenous Stopped 02/02/21 2242)  0.9 %  sodium chloride infusion (Manually program via Guardrails IV Fluids) (0 mLs Intravenous Stopped 02/03/21 0757)    ED Course  I have reviewed the triage vital signs and the nursing notes.  Pertinent labs & imaging results that were available during my care of the patient were reviewed by me and considered in my medical decision making (see chart for details).    MDM Rules/Calculators/A&P                           50 year old female presenting to the emerged part with multiple complaints to include shortness of breath and worsening left arm redness and swelling.  On arrival, the patient was afebrile, mildly tachycardic P102, not tachypneic, saturating well on room air, normotensive.  The patient presents with worsening dyspnea at rest and on exertion.  Differential diagnosis includes PE, pneumonia, pneumothorax, anemia.  My primary concern is of blood loss  anemia given the patient's recent requirement for multiple transfusions postoperatively.  Additionally, the patient presents with concerning findings for cellulitis along the left forearm.  The patient's left hand compartments are soft and she is neurovascularly intact in the left upper extremity.  No concern for compartment syndrome.  On arrival, the patient's EKG revealed ventricular rate 88, normal sinus rhythm, no ischemic changes noted.  A chest x-ray was performed which revealed no pleural effusion, no focal consolidation, no pneumothorax.  Laboratory work-up was positive for a D-dimer of 1.76.  A CTA PE study was performed which revealed no evidence of  pulmonary embolism, pneumonia or pneumothorax.  Mild cardiomegaly was noted with some coronary calcifications noted.  Bilateral thyroid masses were noted.  CMP was unremarkable.  Troponins x2 negative.  Low concern for ACS at this time with reassuring EKG and negative troponins.  PT/INR, COVID-19 and influenza PCR were negative.  Patient had a CBC with an anemia noted to 7.4.  Concern for symptomatic anemia in the setting of patient's recent blood loss.  She was administered IV Ancef while awaiting her test results but likely can be switched over to oral medications for her left forearm cellulitis.  Given the patient's acute symptomatic anemia with a hemoglobin of 7.4, the patient was consented for and administered 1 unit PRBCs.  Hospitalist medicine was consulted for admission and Dr. Myna Hidalgo accepted the patient in admission.   Final Clinical Impression(s) / ED Diagnoses Final diagnoses:  Cellulitis of left forearm  Symptomatic anemia    Rx / DC Orders ED Discharge Orders     None        Regan Lemming, MD 02/03/21 1319

## 2021-02-03 DIAGNOSIS — D649 Anemia, unspecified: Secondary | ICD-10-CM | POA: Diagnosis not present

## 2021-02-03 DIAGNOSIS — E079 Disorder of thyroid, unspecified: Secondary | ICD-10-CM

## 2021-02-03 DIAGNOSIS — M7989 Other specified soft tissue disorders: Secondary | ICD-10-CM | POA: Diagnosis not present

## 2021-02-03 LAB — BASIC METABOLIC PANEL
Anion gap: 7 (ref 5–15)
BUN: 17 mg/dL (ref 6–20)
CO2: 26 mmol/L (ref 22–32)
Calcium: 9.1 mg/dL (ref 8.9–10.3)
Chloride: 102 mmol/L (ref 98–111)
Creatinine, Ser: 0.71 mg/dL (ref 0.44–1.00)
GFR, Estimated: >60 mL/min (ref >60)
Glucose, Bld: 101 mg/dL — ABNORMAL HIGH (ref 70–99)
Potassium: 3.7 mmol/L (ref 3.5–5.1)
Sodium: 135 mmol/L (ref 135–145)

## 2021-02-03 LAB — CBC WITH DIFFERENTIAL/PLATELET
Abs Immature Granulocytes: 0.09 10*3/uL — ABNORMAL HIGH (ref 0.00–0.07)
Basophils Absolute: 0 10*3/uL (ref 0.0–0.1)
Basophils Relative: 0 %
Eosinophils Absolute: 0.1 10*3/uL (ref 0.0–0.5)
Eosinophils Relative: 1 %
HCT: 25.4 % — ABNORMAL LOW (ref 36.0–46.0)
Hemoglobin: 7.4 g/dL — ABNORMAL LOW (ref 12.0–15.0)
Immature Granulocytes: 1 %
Lymphocytes Relative: 14 %
Lymphs Abs: 1.6 10*3/uL (ref 0.7–4.0)
MCH: 21.2 pg — ABNORMAL LOW (ref 26.0–34.0)
MCHC: 29.1 g/dL — ABNORMAL LOW (ref 30.0–36.0)
MCV: 72.8 fL — ABNORMAL LOW (ref 80.0–100.0)
Monocytes Absolute: 0.9 10*3/uL (ref 0.1–1.0)
Monocytes Relative: 8 %
Neutro Abs: 8.9 10*3/uL — ABNORMAL HIGH (ref 1.7–7.7)
Neutrophils Relative %: 76 %
Platelets: 441 10*3/uL — ABNORMAL HIGH (ref 150–400)
RBC: 3.49 MIL/uL — ABNORMAL LOW (ref 3.87–5.11)
RDW: 19.3 % — ABNORMAL HIGH (ref 11.5–15.5)
WBC: 11.6 10*3/uL — ABNORMAL HIGH (ref 4.0–10.5)
nRBC: 0.7 % — ABNORMAL HIGH (ref 0.0–0.2)

## 2021-02-03 LAB — RESP PANEL BY RT-PCR (FLU A&B, COVID) ARPGX2
Influenza A by PCR: NEGATIVE
Influenza B by PCR: NEGATIVE
SARS Coronavirus 2 by RT PCR: NEGATIVE

## 2021-02-03 LAB — PREPARE RBC (CROSSMATCH)

## 2021-02-03 LAB — BRAIN NATRIURETIC PEPTIDE: B Natriuretic Peptide: 23.5 pg/mL (ref 0.0–100.0)

## 2021-02-03 LAB — HIV ANTIBODY (ROUTINE TESTING W REFLEX): HIV Screen 4th Generation wRfx: NONREACTIVE

## 2021-02-03 MED ORDER — DICLOFENAC SODIUM 1 % EX GEL
2.0000 g | Freq: Four times a day (QID) | CUTANEOUS | Status: DC
  Administered 2021-02-03 – 2021-02-04 (×4): 2 g via TOPICAL
  Filled 2021-02-03: qty 100

## 2021-02-03 MED ORDER — ACETAMINOPHEN 325 MG PO TABS: 650.0000 mg | ORAL_TABLET | Freq: Four times a day (QID) | ORAL | Status: DC | PRN

## 2021-02-03 MED ORDER — ONDANSETRON HCL 4 MG/2ML IJ SOLN: 4.0000 mg | Freq: Four times a day (QID) | INTRAMUSCULAR | Status: DC | PRN

## 2021-02-03 MED ORDER — SENNOSIDES-DOCUSATE SODIUM 8.6-50 MG PO TABS: 1.0000 | ORAL_TABLET | Freq: Every evening | ORAL | Status: DC | PRN

## 2021-02-03 MED ORDER — IBUPROFEN 200 MG PO TABS: 400.0000 mg | ORAL_TABLET | Freq: Four times a day (QID) | ORAL | Status: DC | PRN

## 2021-02-03 MED ORDER — AMLODIPINE BESYLATE 5 MG PO TABS: 5.0000 mg | ORAL_TABLET | Freq: Every day | ORAL | Status: DC

## 2021-02-03 MED ORDER — BISACODYL 5 MG PO TBEC
5.0000 mg | DELAYED_RELEASE_TABLET | Freq: Every day | ORAL | Status: DC | PRN
  Administered 2021-02-03: 22:00:00 5 mg via ORAL
  Filled 2021-02-03: qty 1

## 2021-02-03 MED ORDER — ONDANSETRON HCL 4 MG PO TABS: 4.0000 mg | ORAL_TABLET | Freq: Four times a day (QID) | ORAL | Status: DC | PRN

## 2021-02-03 MED ORDER — SODIUM CHLORIDE 0.9% IV SOLUTION: Freq: Once | INTRAVENOUS | Status: AC

## 2021-02-03 MED ORDER — OXYCODONE HCL 5 MG PO TABS
5.0000 mg | ORAL_TABLET | Freq: Four times a day (QID) | ORAL | Status: DC | PRN
Start: 2021-02-03 — End: 2021-02-04
  Administered 2021-02-03 – 2021-02-04 (×4): 10 mg via ORAL
  Filled 2021-02-03 (×4): qty 2

## 2021-02-03 NOTE — Progress Notes (Signed)
[facility] OF CARE NOTE Patient: Peggy Payne JSC:[id number]   PCP: Horald Pollen, MD DOB: 1970-07-17   DOA: 02/02/2021   DOS: 02/03/2021    Patient was admitted by my colleague earlier on 02/03/2021. I have reviewed the H&P as well as assessment and plan and agree with the same. Important changes in the plan are listed below.  Plan of care: Principal Problem:   Symptomatic anemia Active Problems:   Mass of thyroid gland   Swelling of left hand  Left hand swelling thrombophlebitis. Add diclofenac gel and cold compress.    Author: Berle Mull, MD [facility] 02/03/2021 2:29 PM   If 7PM-7AM, please contact night-coverage at [email or web address]

## 2021-02-03 NOTE — H&P (Signed)
History and Physical    Peggy Payne RWE:[id number] DOB: 07/28/1970 DOA: 02/02/2021  PCP: Horald Pollen, MD   Patient coming from: Home   Chief Complaint: Left hand swelling and pain, DOE, fatigue   HPI: Peggy Payne is a pleasant 50 y.o. female with medical history significant for hypertension, uterine fibroids, and bilateral adnexal masses status post hysterectomy and right salpingectomy on 01/29/2021, now presenting to the emergency department for evaluation of left hand pain and swelling, fatigue, and exertional dyspnea.  Patient developed anemia after her recent surgery, was given an iron infusion prior to discharge, reports that the IV in her left hand infiltrated and she has had pain and swelling.  She has also had persistent fatigue and exertional dyspnea.  Pain has been well controlled with Advil and 5 mg oxycodone, she has been moving her bowels, and she denies any bleeding.  She denies fevers, chills, or cough.  ED Course: Upon arrival to the ED, patient is found to be afebrile, saturating well on room air, and with stable blood pressure.  EKG features sinus rhythm and chest x-ray negative for acute cardiopulmonary disease.  CTA chest is negative for acute findings but notable for bilateral thyroid masses.  Chemistry panel was unremarkable, troponin normal x2, BNP normal, and hemoglobin was 7.4.  Patient was given a dose of Ancef and 1 unit of RBC was ordered for transfusion from the ED.  Review of Systems:  All other systems reviewed and apart from HPI, are negative.  Past Medical History:  Diagnosis Date   Anemia    Fibroid    Hypertension     Past Surgical History:  Procedure Laterality Date   HYSTERECTOMY ABDOMINAL WITH SALPINGO-OOPHORECTOMY Bilateral 01/29/2021   Procedure: SUPRACERVICAL ABDOMINAL HYSTERECTOMY  WITH BILATERAL SALPINGO AND LEFT OOPHORECTOMY;  Surgeon: Lavonia Drafts, MD;  Location: [facility];  Service: Gynecology;  Laterality: Bilateral;  TAP  Block   MYOMECTOMY  03/03/2009   WISDOM TOOTH EXTRACTION      Social History:   reports that she has been smoking cigarettes. She has a 7.50 pack-year smoking history. She has never used smokeless tobacco. She reports current alcohol use. She reports that she does not use drugs.  No Known Allergies  Family History  Problem Relation Age of Onset   Hypertension Mother    Cancer Father      Prior to Admission medications   Medication Sig Start Date End Date Taking? Authorizing Provider  amLODipine (NORVASC) 5 MG tablet Take 1 tablet (5 mg total) by mouth daily. 01/23/21  Yes Sagardia, Ines Bloomer, MD  docusate sodium (COLACE) 100 MG capsule Take 1 capsule (100 mg total) by mouth 2 (two) times daily. 01/31/21  Yes Lavonia Drafts, MD  ibuprofen (ADVIL) 600 MG tablet Take 1 tablet (600 mg total) by mouth every 6 (six) hours. 01/31/21  Yes Lavonia Drafts, MD  oxyCODONE-acetaminophen (PERCOCET/ROXICET) 5-325 MG tablet Take 1 tablet by mouth every 6 (six) hours as needed for moderate pain. 01/31/21  Yes Lavonia Drafts, MD  Fe Fum-FePoly-Vit C-Vit B3 (INTEGRA) 62.5-62.5-40-3 MG CAPS Take 1 capsule by mouth daily. 01/31/21   Lavonia Drafts, MD    Physical Exam: Vitals:   02/02/21 2300 02/02/21 2330 02/03/21 0040 02/03/21 0045  BP: (!) 146/70 137/78    Pulse: 91 91 86 91  Resp: 20 18 14 16   Temp:      TempSrc:      SpO2: 99% 100% 100% 100%    Constitutional: NAD, calm  Eyes: PERTLA, lids and conjunctivae normal ENMT: Mucous membranes are moist. Posterior pharynx clear of any exudate or lesions.   Neck: supple, non tender Respiratory: clear to auscultation bilaterally, no wheezing, no crackles. No accessory muscle use.  Cardiovascular: S1 & S2 heard, regular rate and rhythm. No significant JVD. Abdomen: No distension, no tenderness, soft. Bowel sounds active.  Musculoskeletal: no clubbing / cyanosis. No joint deformity upper and lower extremities.    Skin: faint erythema and edema involving distal LUE. Warm, dry, well-perfused. Neurologic: CN 2-12 grossly intact. Sensation intact to light touch intact. Strength 5/5 in all 4 limbs. Alert and oriented.  Psychiatric: Very pleasant. Cooperative.    Labs and Imaging on Admission: I have personally reviewed following labs and imaging studies  CBC: Recent Labs  Lab 01/29/21 1111 01/30/21 0047 01/31/21 0107 02/02/21 0003  WBC 5.9 16.4* 11.5* 11.6*  NEUTROABS  --   --  8.5* 8.9*  HGB 10.2* 7.7* 6.4* 7.4*  HCT 35.4* 25.9* 21.3* 25.4*  MCV 71.7* 70.4* 70.1* 72.8*  PLT 451* 361 309 951*   Basic Metabolic Panel: Recent Labs  Lab 01/29/21 1111 02/02/21 1722  NA 136 136  K 3.6 3.6  CL 104 102  CO2 21* 24  GLUCOSE 105* 121*  BUN 13 18  CREATININE 0.73 0.79  CALCIUM 9.2 9.3   GFR: Estimated Creatinine Clearance: 89.3 mL/min (by C-G formula based on SCr of 0.79 mg/dL). Liver Function Tests: Recent Labs  Lab 02/02/21 1722  AST 28  ALT 21  ALKPHOS 64  BILITOT 0.5  PROT 7.5  ALBUMIN 4.0   No results for input(s): LIPASE, AMYLASE in the last 168 hours. No results for input(s): AMMONIA in the last 168 hours. Coagulation Profile: Recent Labs  Lab 02/02/21 1835  INR 0.9   Cardiac Enzymes: No results for input(s): CKTOTAL, CKMB, CKMBINDEX, TROPONINI in the last 168 hours. BNP (last 3 results) No results for input(s): PROBNP in the last 8760 hours. HbA1C: No results for input(s): HGBA1C in the last 72 hours. CBG: No results for input(s): GLUCAP in the last 168 hours. Lipid Profile: No results for input(s): CHOL, HDL, LDLCALC, TRIG, CHOLHDL, LDLDIRECT in the last 72 hours. Thyroid Function Tests: No results for input(s): TSH, T4TOTAL, FREET4, T3FREE, THYROIDAB in the last 72 hours. Anemia Panel: No results for input(s): VITAMINB12, FOLATE, FERRITIN, TIBC, IRON, RETICCTPCT in the last 72 hours. Urine analysis:    Component Value Date/Time   COLORURINE STRAW (A)  08/22/2020 1900   APPEARANCEUR CLEAR 08/22/2020 1900   LABSPEC 1.006 08/22/2020 1900   PHURINE 5.0 08/22/2020 1900   GLUCOSEU NEGATIVE 08/22/2020 1900   HGBUR NEGATIVE 08/22/2020 1900   BILIRUBINUR NEGATIVE 08/22/2020 1900   KETONESUR NEGATIVE 08/22/2020 1900   PROTEINUR NEGATIVE 08/22/2020 1900   NITRITE NEGATIVE 08/22/2020 1900   LEUKOCYTESUR NEGATIVE 08/22/2020 1900   Sepsis Labs: @LABRCNTIP (procalcitonin:4,lacticidven:4) ) Recent Results (from the past 240 hour(s))  SARS Coronavirus 2 by RT PCR (hospital order, performed in [facility] hospital lab) Nasopharyngeal Nasopharyngeal Swab     Status: None   Collection Time: 01/29/21 11:08 AM   Specimen: Nasopharyngeal Swab  Result Value Ref Range Status   SARS Coronavirus 2 NEGATIVE NEGATIVE Final    Comment: (NOTE) SARS-CoV-2 target nucleic acids are NOT DETECTED.  The SARS-CoV-2 RNA is generally detectable in upper and lower respiratory specimens during the acute phase of infection. The lowest concentration of SARS-CoV-2 viral copies this assay can detect is 250 copies / mL. A negative result does  not preclude SARS-CoV-2 infection and should not be used as the sole basis for treatment or other patient management decisions.  A negative result may occur with improper specimen collection / handling, submission of specimen other than nasopharyngeal swab, presence of viral mutation(s) within the areas targeted by this assay, and inadequate number of viral copies (<250 copies / mL). A negative result must be combined with clinical observations, patient history, and epidemiological information.  Fact Sheet for Patients:   StrictlyIdeas.no  Fact Sheet for Healthcare Providers: BankingDealers.co.za  This test is not yet approved or  cleared by the [place] FDA and has been authorized for detection and/or diagnosis of SARS-CoV-2 by FDA under an Emergency Use Authorization (EUA).   This EUA will remain in effect (meaning this test can be used) for the duration of the COVID-19 declaration under Section 564(b)(1) of the Act, 21 U.S.C. section 360bbb-3(b)(1), unless the authorization is terminated or revoked sooner.  Performed at [facility], [place] [address], [place], [place] [address]      Radiological Exams on Admission: DG Chest 2 View  Result Date: 02/02/2021 CLINICAL DATA:  Shortness of breath, left hand extravasation after iron infusion 2 days ago. EXAM: CHEST - 2 VIEW COMPARISON:  None. FINDINGS: The heart size and mediastinal contours are within normal limits. No focal consolidation. No pleural effusion. No pneumothorax. The visualized skeletal structures are unremarkable. IMPRESSION: No active cardiopulmonary disease. Electronically Signed   By: Dahlia Bailiff M.D.   On: 02/02/2021 18:30   CT Angio Chest PE W and/or Wo Contrast  Result Date: 02/02/2021 CLINICAL DATA:  Shortness of breath, positive D-dimer, suspected pulmonary embolism. EXAM: CT ANGIOGRAPHY CHEST WITH CONTRAST TECHNIQUE: Multidetector CT imaging of the chest was performed using the standard protocol during bolus administration of intravenous contrast. Multiplanar CT image reconstructions and MIPs were obtained to evaluate the vascular anatomy. CONTRAST:  64mL OMNIPAQUE IOHEXOL 350 MG/ML SOLN COMPARISON:  PA and lateral chest today, abdomen and pelvis CT 08/22/2020. No prior cross-sectional chest imaging. FINDINGS: Cardiovascular: There is mild panchamber cardiomegaly without findings of acute right heart strain. No pericardial effusion is seen. There are minimal calcifications in the left main and proximal LAD coronary arteries. There is no venous dilatation. Pulmonary arteries are normal in caliber without appreciable embolic filling defects. The aorta and branch vessels are normal. No plaques or dissection are noted and no aneurysm. Mediastinum/Nodes: There are no enlarged intrathoracic or  axillary lymph nodes. There is a 2.8 cm heterogeneous mass in the right lobe of the thyroid gland and 2.1 cm heterogeneous mass in the left lobe. Thoracic esophagus is unremarkable as visualized. Lungs/Pleura: There is no pleural effusion, thickening or pneumothorax. There are mild paraseptal emphysematous changes in the lung apices. The lungs are clear. Upper Abdomen: No acute abnormality. Musculoskeletal: No chest wall abnormality. No acute or significant osseous findings. Review of the MIP images confirms the above findings. IMPRESSION: 1. No acute chest CT or CTA findings. 2. Mild cardiomegaly, small amount of calcification left main and LAD coronary arteries. 3. Bilateral thyroid masses. Ultrasound routine follow-up recommended. 4. Early paraseptal emphysematous change lung apices. Electronically Signed   By: Telford Nab M.D.   On: 02/02/2021 22:04    EKG: Independently reviewed. Sinus rhythm.   Assessment/Plan   1. Symptomatic anemia  - P/w DOE and fatigue after recent hysterectomy and has Hgb 7.4, down from 10.2 on 01/29/21  - No active bleeding  - 1 unit RBC ordered for transfusion from ED  -  Repeat CBC and reassess sxs post-transfusion   2. Left hand pain and swelling  - Lt hand pain and swelling pt attributes to recent IV infiltration  - Compartments are soft and she is neurovascularly intact  - Continue supportive care, elevation    3. Hypertension  - BP at goal, continue Norvasc   4. Thyroid masses  - Discussed with patient, outpatient follow-up with [place] recommended    DVT prophylaxis: SCDs Code Status: Full  Level of Care: Level of care: Med-Surg Family Communication: None present  Disposition Plan:  Patient is from: home  Anticipated d/c is to: Home  Anticipated d/c date is: 12/4 or 02/04/21 Patient currently: Pending post-transfusion CBC  Consults called: none  Admission status: Observation     Vianne Bulls, MD [facility]  02/03/2021, 1:31 AM

## 2021-02-04 ENCOUNTER — Observation Stay (HOSPITAL_BASED_OUTPATIENT_CLINIC_OR_DEPARTMENT_OTHER): Payer: 59

## 2021-02-04 DIAGNOSIS — R609 Edema, unspecified: Secondary | ICD-10-CM

## 2021-02-04 DIAGNOSIS — D649 Anemia, unspecified: Secondary | ICD-10-CM | POA: Diagnosis not present

## 2021-02-04 LAB — TYPE AND SCREEN
ABO/RH(D): O POS
Antibody Screen: NEGATIVE
Unit division: 0

## 2021-02-04 LAB — CBC
HCT: 33 % — ABNORMAL LOW (ref 36.0–46.0)
Hemoglobin: 10.1 g/dL — ABNORMAL LOW (ref 12.0–15.0)
MCH: 23.1 pg — ABNORMAL LOW (ref 26.0–34.0)
MCHC: 30.6 g/dL (ref 30.0–36.0)
MCV: 75.5 fL — ABNORMAL LOW (ref 80.0–100.0)
Platelets: 460 10*3/uL — ABNORMAL HIGH (ref 150–400)
RBC: 4.37 MIL/uL (ref 3.87–5.11)
RDW: 22.9 % — ABNORMAL HIGH (ref 11.5–15.5)
WBC: 10.3 10*3/uL (ref 4.0–10.5)
nRBC: 0 % (ref 0.0–0.2)

## 2021-02-04 LAB — BPAM RBC
Blood Product Expiration Date: 202301022359
ISSUE DATE / TIME: 202212040204
Unit Type and Rh: 5100

## 2021-02-04 MED ORDER — DICLOFENAC SODIUM 1 % EX GEL: 2.0000 g | Freq: Four times a day (QID) | CUTANEOUS | 0 refills | Status: AC

## 2021-02-04 NOTE — Progress Notes (Signed)
Left upper extremity venous duplex has been completed. Preliminary results can be found in CV Proc through chart review.  Results were given to the patient's nurse, Hildred Alamin.  02/04/21 10:56 AM Carlos Levering RVT

## 2021-02-05 ENCOUNTER — Telehealth: Payer: Self-pay | Admitting: Obstetrics & Gynecology

## 2021-02-05 NOTE — Telephone Encounter (Signed)
TC to Peggy Payne after hearing that she was in the hospital for LUE edema. Peggy Payne reports that her IV infiltrated with the iron. She reports that she did not get to complete the infusion. She was seen in the ED and admitted. She went home on yesterday. She denies pain at present.   I reviewed her UE doppler results. Peggy Payne reports continued swelling. Peggy Payne asked if she could come in to ofc tomorrow for eval. She will f/u on tomorrow. She denies other complaints.  All questions answered.   Shayda Kalka L. Harraway-Smith, M.D., Cherlynn June

## 2021-02-06 ENCOUNTER — Other Ambulatory Visit: Payer: Self-pay

## 2021-02-06 ENCOUNTER — Ambulatory Visit (INDEPENDENT_AMBULATORY_CARE_PROVIDER_SITE_OTHER): Payer: 59 | Admitting: Obstetrics & Gynecology

## 2021-02-06 ENCOUNTER — Encounter: Payer: Self-pay | Admitting: Obstetrics & Gynecology

## 2021-02-06 VITALS — BP 134/66 | HR 78 | Temp 98.5°F | Wt 170.0 lb

## 2021-02-06 DIAGNOSIS — Z9889 Other specified postprocedural states: Secondary | ICD-10-CM

## 2021-02-06 DIAGNOSIS — D62 Acute posthemorrhagic anemia: Secondary | ICD-10-CM

## 2021-02-06 DIAGNOSIS — I808 Phlebitis and thrombophlebitis of other sites: Secondary | ICD-10-CM

## 2021-02-06 MED ORDER — INTEGRA 62.5-62.5-40-3 MG PO CAPS: 1.0000 | ORAL_CAPSULE | Freq: Every day | ORAL | 3 refills | Status: AC

## 2021-02-06 NOTE — Progress Notes (Signed)
History:  50 y.o. G1P0010 here today for eval of left arm. Pt reports extravasation of IV iron. She denies pain in the arm but, does report continued swelling. She denies other problems. Noo dizziness or SOB. Pt reports that she lost her po iron Rx and needs another rx. She did pick it up but, thinks that it may have been discarded accidentlyl. She has min pain and is voiding and passing gas and stools without difficulty.    The following portions of the patient's history were reviewed and updated as appropriate: allergies, current medications, past family history, past medical history, past social history, past surgical history and problem list.  Review of Systems:  Pertinent items are noted in HPI.    Objective:  Physical Exam Weight 170 lb (77.1 kg), last menstrual period 01/26/2021.  CONSTITUTIONAL: Well-developed, well-nourished female in no acute distress.  HENT:  Normocephalic, atraumatic EYES: Conjunctivae and EOM are normal. No scleral icterus.  NECK: Normal range of motion SKIN: Skin is warm and dry. No rash noted. Not diaphoretic.No pallor. [place]: Alert and oriented to person, place, and time. Normal coordination.  Left arm: some induration superficially but, no appreciable swelling noted.  Abd: Soft, nontender and nondistended; incision C/D/I. Steristrips removed.  Pelvic: deferred  Labs and Imaging DG Chest 2 View  Result Date: 02/02/2021 CLINICAL DATA:  Shortness of breath, left hand extravasation after iron infusion 2 days ago. EXAM: CHEST - 2 VIEW COMPARISON:  None. FINDINGS: The heart size and mediastinal contours are within normal limits. No focal consolidation. No pleural effusion. No pneumothorax. The visualized skeletal structures are unremarkable. IMPRESSION: No active cardiopulmonary disease. Electronically Signed   By: Dahlia Bailiff M.D.   On: 02/02/2021 18:30   CT Angio Chest PE W and/or Wo Contrast  Result Date: 02/02/2021 CLINICAL DATA:  Shortness of  breath, positive D-dimer, suspected pulmonary embolism. EXAM: CT ANGIOGRAPHY CHEST WITH CONTRAST TECHNIQUE: Multidetector CT imaging of the chest was performed using the standard protocol during bolus administration of intravenous contrast. Multiplanar CT image reconstructions and MIPs were obtained to evaluate the vascular anatomy. CONTRAST:  33mL OMNIPAQUE IOHEXOL 350 MG/ML SOLN COMPARISON:  PA and lateral chest today, abdomen and pelvis CT 08/22/2020. No prior cross-sectional chest imaging. FINDINGS: Cardiovascular: There is mild panchamber cardiomegaly without findings of acute right heart strain. No pericardial effusion is seen. There are minimal calcifications in the left main and proximal LAD coronary arteries. There is no venous dilatation. Pulmonary arteries are normal in caliber without appreciable embolic filling defects. The aorta and branch vessels are normal. No plaques or dissection are noted and no aneurysm. Mediastinum/Nodes: There are no enlarged intrathoracic or axillary lymph nodes. There is a 2.8 cm heterogeneous mass in the right lobe of the thyroid gland and 2.1 cm heterogeneous mass in the left lobe. Thoracic esophagus is unremarkable as visualized. Lungs/Pleura: There is no pleural effusion, thickening or pneumothorax. There are mild paraseptal emphysematous changes in the lung apices. The lungs are clear. Upper Abdomen: No acute abnormality. Musculoskeletal: No chest wall abnormality. No acute or significant osseous findings. Review of the MIP images confirms the above findings. IMPRESSION: 1. No acute chest CT or CTA findings. 2. Mild cardiomegaly, small amount of calcification left main and LAD coronary arteries. 3. Bilateral thyroid masses. Ultrasound routine follow-up recommended. 4. Early paraseptal emphysematous change lung apices. Electronically Signed   By: Telford Nab M.D.   On: 02/02/2021 22:04   VAS [place] UPPER EXTREMITY VENOUS DUPLEX  Result Date: 02/04/2021 UPPER VENOUS  STUDY  Patient Name:  DORANN DAVIDSON Goodwill  Date of Exam:   02/04/2021 Medical Rec #: [id number]       Accession #:    [id number] Date of Birth: 08-Apr-1970        Patient Gender: F Patient Age:   73 years Exam Location:  [facility] Procedure:      VAS [place] UPPER EXTREMITY VENOUS DUPLEX Referring Phys: PRANAV PATEL --------------------------------------------------------------------------------  Indications: Edema Risk Factors: None identified. Comparison Study: No prior studies. Performing Technologist: Oliver Hum RVT  Examination Guidelines: A complete evaluation includes B-mode imaging, spectral Doppler, color Doppler, and power Doppler as needed of all accessible portions of each vessel. Bilateral testing is considered an integral part of a complete examination. Limited examinations for reoccurring indications may be performed as noted.  Right Findings: +----------+------------+---------+-----------+----------+-------+ RIGHT     CompressiblePhasicitySpontaneousPropertiesSummary +----------+------------+---------+-----------+----------+-------+ Subclavian    Full       Yes       Yes                      +----------+------------+---------+-----------+----------+-------+  Left Findings: +----------+------------+---------+-----------+----------+-------+ LEFT      CompressiblePhasicitySpontaneousPropertiesSummary +----------+------------+---------+-----------+----------+-------+ IJV           Full       Yes       Yes                      +----------+------------+---------+-----------+----------+-------+ Subclavian    Full       Yes       Yes                      +----------+------------+---------+-----------+----------+-------+ Axillary      Full       Yes       Yes                      +----------+------------+---------+-----------+----------+-------+ Brachial      Full       Yes       Yes                       +----------+------------+---------+-----------+----------+-------+ Radial        Full                                          +----------+------------+---------+-----------+----------+-------+ Ulnar         Full                                          +----------+------------+---------+-----------+----------+-------+ Cephalic      None                                   Acute  +----------+------------+---------+-----------+----------+-------+ Basilic       Full                                          +----------+------------+---------+-----------+----------+-------+ Superficial thrombus noted within the cephalic vein is located in the mid forearm only.  Summary:  Right: No evidence of thrombosis in the subclavian.  Left: No evidence of deep vein thrombosis in the upper extremity. Findings consistent with acute superficial vein thrombosis involving the left cephalic vein.  *See table(s) above for measurements and observations.  Diagnosing physician: Monica Martinez MD Electronically signed by Monica Martinez MD on 02/04/2021 at 1:49:20 PM.    Final     Assessment & Plan:   Diagnoses and all orders for this visit:  Post-operative state  Superficial thrombophlebitis of left upper extremity  Anemia due to acute blood loss -     Fe Fum-FePoly-Vit C-Vit B3 (INTEGRA) 62.5-62.5-40-3 MG CAPS; Take 1 capsule by mouth daily.   F/u in 5 weeks or sooner prn  CBC at next visit.   Oran Dillenburg L. Harraway-Smith, M.D., Cherlynn June

## 2021-02-06 NOTE — Discharge Summary (Signed)
Physician Discharge Summary   Patient name: Peggy Payne  Admit date:     02/02/2021  Discharge date: 02/04/2021   Discharge Physician: Berle Mull   PCP: Horald Pollen, MD   Recommendations at discharge: follow up with PCP in 1 week  Discharge Diagnoses Principal Problem:   Symptomatic anemia Active Problems:   Mass of thyroid gland   Swelling of left hand SVT  Hospital Course   1. Symptomatic anemia   P/w DOE and fatigue after recent hysterectomy and has Hgb 7.4, down from 10.2 on 01/29/21  No active bleeding  1 unit RBC ordered for transfusion from ED  Repeat CBC remains stable.    2. Left hand pain and swelling Thrombophlebitis with SVT  Lt hand pain and swelling pt attributes to recent IV infiltration  Compartments are soft and she is neurovascularly intact  Doppler positive for SVT Treated with elevation and topical diclofenac.    3. Hypertension   BP at goal, continue Norvasc    4. Thyroid masses  Discussed with patient, outpatient follow-up with [place] recommended   Procedures performed: upper extremity doppler    Condition at discharge: good  Exam General: Appear in mild distress, no Rash; Oral Mucosa Clear, moist. no Abnormal Neck Mass Or lumps, Conjunctiva normal  Cardiovascular: S1 and S2 Present, no Murmur, Respiratory: good respiratory effort, Bilateral Air entry present and CTA, no Crackles, no wheezes Abdomen: Bowel Sound present, Soft and no tenderness Extremities: no Pedal edema, left upper extremity edema and warmth.  Neurology: alert and oriented to time, place, and person affect appropriate. no new focal deficit  Disposition: Home  Discharge time: greater than 30 minutes.  Follow-up Information     Horald Pollen, MD. Schedule an appointment as soon as possible for a visit in 1 week(s).   Specialty: Internal Medicine Contact information: [place] [address] [phone]                  Allergies as of 02/04/2021   No Known Allergies      Medication List     STOP taking these medications    ibuprofen 600 MG tablet Commonly known as: ADVIL       TAKE these medications    amLODipine 5 MG tablet Commonly known as: NORVASC Take 1 tablet (5 mg total) by mouth daily.   diclofenac Sodium 1 % Gel Commonly known as: VOLTAREN Apply 2 g topically 4 (four) times daily for 6 days.   docusate sodium 100 MG capsule Commonly known as: COLACE Take 1 capsule (100 mg total) by mouth 2 (two) times daily.   Integra 62.5-62.5-40-3 MG Caps Take 1 capsule by mouth daily.   oxyCODONE-acetaminophen 5-325 MG tablet Commonly known as: PERCOCET/ROXICET Take 1 tablet by mouth every 6 (six) hours as needed for moderate pain.               Discharge Care Instructions  (From admission, onward)           Start     Ordered   02/04/21 0000  No dressing needed        02/04/21 1034            There were no vitals filed for this visit.  DG Chest 2 View  Result Date: 02/02/2021 CLINICAL DATA:  Shortness of breath, left hand extravasation after iron infusion 2 days ago. EXAM: CHEST - 2 VIEW COMPARISON:  None. FINDINGS: The heart size and mediastinal contours  are within normal limits. No focal consolidation. No pleural effusion. No pneumothorax. The visualized skeletal structures are unremarkable. IMPRESSION: No active cardiopulmonary disease. Electronically Signed   By: Dahlia Bailiff M.D.   On: 02/02/2021 18:30   CT Angio Chest PE W and/or Wo Contrast  Result Date: 02/02/2021 CLINICAL DATA:  Shortness of breath, positive D-dimer, suspected pulmonary embolism. EXAM: CT ANGIOGRAPHY CHEST WITH CONTRAST TECHNIQUE: Multidetector CT imaging of the chest was performed using the standard protocol during bolus administration of intravenous contrast. Multiplanar CT image reconstructions and MIPs were obtained to evaluate the vascular anatomy. CONTRAST:  62mL OMNIPAQUE  IOHEXOL 350 MG/ML SOLN COMPARISON:  PA and lateral chest today, abdomen and pelvis CT 08/22/2020. No prior cross-sectional chest imaging. FINDINGS: Cardiovascular: There is mild panchamber cardiomegaly without findings of acute right heart strain. No pericardial effusion is seen. There are minimal calcifications in the left main and proximal LAD coronary arteries. There is no venous dilatation. Pulmonary arteries are normal in caliber without appreciable embolic filling defects. The aorta and branch vessels are normal. No plaques or dissection are noted and no aneurysm. Mediastinum/Nodes: There are no enlarged intrathoracic or axillary lymph nodes. There is a 2.8 cm heterogeneous mass in the right lobe of the thyroid gland and 2.1 cm heterogeneous mass in the left lobe. Thoracic esophagus is unremarkable as visualized. Lungs/Pleura: There is no pleural effusion, thickening or pneumothorax. There are mild paraseptal emphysematous changes in the lung apices. The lungs are clear. Upper Abdomen: No acute abnormality. Musculoskeletal: No chest wall abnormality. No acute or significant osseous findings. Review of the MIP images confirms the above findings. IMPRESSION: 1. No acute chest CT or CTA findings. 2. Mild cardiomegaly, small amount of calcification left main and LAD coronary arteries. 3. Bilateral thyroid masses. Ultrasound routine follow-up recommended. 4. Early paraseptal emphysematous change lung apices. Electronically Signed   By: Telford Nab M.D.   On: 02/02/2021 22:04   VAS [place] UPPER EXTREMITY VENOUS DUPLEX  Result Date: 02/04/2021 UPPER VENOUS STUDY  Patient Name:  Peggy Payne  Date of Exam:   02/04/2021 Medical Rec #: [id number]       Accession #:    [id number] Date of Birth: 1970-05-28        Patient Gender: F Patient Age:   50 years Exam Location:  [facility] Procedure:      VAS [place] UPPER EXTREMITY VENOUS DUPLEX Referring Phys: Ulla Mckiernan  --------------------------------------------------------------------------------  Indications: Edema Risk Factors: None identified. Comparison Study: No prior studies. Performing Technologist: Oliver Hum RVT  Examination Guidelines: A complete evaluation includes B-mode imaging, spectral Doppler, color Doppler, and power Doppler as needed of all accessible portions of each vessel. Bilateral testing is considered an integral part of a complete examination. Limited examinations for reoccurring indications may be performed as noted.  Right Findings: +----------+------------+---------+-----------+----------+-------+ RIGHT     CompressiblePhasicitySpontaneousPropertiesSummary +----------+------------+---------+-----------+----------+-------+ Subclavian    Full       Yes       Yes                      +----------+------------+---------+-----------+----------+-------+  Left Findings: +----------+------------+---------+-----------+----------+-------+ LEFT      CompressiblePhasicitySpontaneousPropertiesSummary +----------+------------+---------+-----------+----------+-------+ IJV           Full       Yes       Yes                      +----------+------------+---------+-----------+----------+-------+ Subclavian    Full  Yes       Yes                      +----------+------------+---------+-----------+----------+-------+ Axillary      Full       Yes       Yes                      +----------+------------+---------+-----------+----------+-------+ Brachial      Full       Yes       Yes                      +----------+------------+---------+-----------+----------+-------+ Radial        Full                                          +----------+------------+---------+-----------+----------+-------+ Ulnar         Full                                          +----------+------------+---------+-----------+----------+-------+ Cephalic      None                                    Acute  +----------+------------+---------+-----------+----------+-------+ Basilic       Full                                          +----------+------------+---------+-----------+----------+-------+ Superficial thrombus noted within the cephalic vein is located in the mid forearm only.  Summary:  Right: No evidence of thrombosis in the subclavian.  Left: No evidence of deep vein thrombosis in the upper extremity. Findings consistent with acute superficial vein thrombosis involving the left cephalic vein.  *See table(s) above for measurements and observations.  Diagnosing physician: Monica Martinez MD Electronically signed by Monica Martinez MD on 02/04/2021 at 1:49:20 PM.    Final    Results for orders placed or performed during the hospital encounter of 02/02/21  Resp Panel by RT-PCR (Flu A&B, Covid) Nasopharyngeal Swab     Status: None   Collection Time: 02/03/21  1:12 AM   Specimen: Nasopharyngeal Swab; Nasopharyngeal(NP) swabs in vial transport medium  Result Value Ref Range Status   SARS Coronavirus 2 by RT PCR NEGATIVE NEGATIVE Final    Comment: (NOTE) SARS-CoV-2 target nucleic acids are NOT DETECTED.  The SARS-CoV-2 RNA is generally detectable in upper respiratory specimens during the acute phase of infection. The lowest concentration of SARS-CoV-2 viral copies this assay can detect is 138 copies/mL. A negative result does not preclude SARS-Cov-2 infection and should not be used as the sole basis for treatment or other patient management decisions. A negative result may occur with  improper specimen collection/handling, submission of specimen other than nasopharyngeal swab, presence of viral mutation(s) within the areas targeted by this assay, and inadequate number of viral copies(<138 copies/mL). A negative result must be combined with clinical observations, patient history, and epidemiological information. The expected result is Negative.  Fact  Sheet for Patients:  EntrepreneurPulse.com.au  Fact Sheet for Healthcare Providers:  IncredibleEmployment.be  This test is  no t yet approved or cleared by the [place] and  has been authorized for detection and/or diagnosis of SARS-CoV-2 by FDA under an Emergency Use Authorization (EUA). This EUA will remain  in effect (meaning this test can be used) for the duration of the COVID-19 declaration under Section 564(b)(1) of the Act, 21 U.S.C.section 360bbb-3(b)(1), unless the authorization is terminated  or revoked sooner.       Influenza A by PCR NEGATIVE NEGATIVE Final   Influenza B by PCR NEGATIVE NEGATIVE Final    Comment: (NOTE) The Xpert Xpress SARS-CoV-2/FLU/RSV plus assay is intended as an aid in the diagnosis of influenza from Nasopharyngeal swab specimens and should not be used as a sole basis for treatment. Nasal washings and aspirates are unacceptable for Xpert Xpress SARS-CoV-2/FLU/RSV testing.  Fact Sheet for Patients: EntrepreneurPulse.com.au  Fact Sheet for Healthcare Providers: IncredibleEmployment.be  This test is not yet approved or cleared by the [place] FDA and has been authorized for detection and/or diagnosis of SARS-CoV-2 by FDA under an Emergency Use Authorization (EUA). This EUA will remain in effect (meaning this test can be used) for the duration of the COVID-19 declaration under Section 564(b)(1) of the Act, 21 U.S.C. section 360bbb-3(b)(1), unless the authorization is terminated or revoked.  Performed at [facility], [address] [place],  [address]    Recent Labs  Lab 01/30/21 [phone] 01/31/21 0107 02/02/21 0003 02/04/21 0837  WBC 16.4* 11.5* 11.6* 10.3  NEUTROABS  --  8.5* 8.9*  --   HGB 7.7* 6.4* 7.4* 10.1*  HCT 25.9* 21.3* 25.4* 33.0*  MCV 70.4* 70.1* 72.8* 75.5*  PLT 361 309 441* 460*   Recent Labs  Lab 02/02/21 1722  02/03/21 0513  NA 136 135  K 3.6 3.7  CL 102 102  CO2 24 26  GLUCOSE 121* 101*  BUN 18 17  CREATININE 0.79 0.71  CALCIUM 9.3 9.1   Recent Labs  Lab 02/02/21 1722  AST 28  ALT 21  ALKPHOS 64  BILITOT 0.5  PROT 7.5  ALBUMIN 4.0   No results for input(s): GLUCAP in the last 168 hours.  Author: Berle Mull [facility]

## 2021-02-11 ENCOUNTER — Encounter: Payer: Self-pay | Admitting: Obstetrics & Gynecology

## 2021-02-11 ENCOUNTER — Encounter: Payer: 59 | Admitting: Obstetrics & Gynecology

## 2021-02-20 ENCOUNTER — Other Ambulatory Visit: Payer: Self-pay

## 2021-02-20 ENCOUNTER — Telehealth: Payer: Self-pay

## 2021-02-20 ENCOUNTER — Ambulatory Visit (INDEPENDENT_AMBULATORY_CARE_PROVIDER_SITE_OTHER): Payer: 59 | Admitting: Obstetrics & Gynecology

## 2021-02-20 ENCOUNTER — Encounter: Payer: Self-pay | Admitting: Obstetrics & Gynecology

## 2021-02-20 VITALS — BP 155/87 | HR 94

## 2021-02-20 DIAGNOSIS — Z9889 Other specified postprocedural states: Secondary | ICD-10-CM

## 2021-02-20 DIAGNOSIS — I808 Phlebitis and thrombophlebitis of other sites: Secondary | ICD-10-CM

## 2021-02-20 NOTE — Progress Notes (Signed)
Patient presents due to left hand feeling bad again. Patient notes some swelling in it since Saturday Dec 17th. Patient does note some spasms in her abdomen as well. Kathrene Alu RN

## 2021-02-20 NOTE — Telephone Encounter (Signed)
Called and left message for patient to return call to clinic. Kathrene Alu RN

## 2021-02-20 NOTE — Telephone Encounter (Signed)
Patient called back and would like Dr. Ihor Dow to know that her Left hand is bothering her again. Patient reports that her IV was inflitrated during her stay for surgery.  Patient states it started hurting her again on Saturday Dec 17th and is a little swollen. Patient made aware I will route message to Dr. Ihor Dow for input. Patient is scheduled for follow up post op on Jan. 11th. Kathrene Alu RN

## 2021-02-20 NOTE — Progress Notes (Signed)
History:  50 y.o. G1P0010 here today for eval of left arm post extravasation of IV iron. Pt reports that her arm was getting better but, recently she experienced pain again and she got worried. No increased swelling. She denies any new heavy lifting or change in activities.   She reports occ tingling in her incision. O/w she is doing well and improving daily.    The following portions of the patient's history were reviewed and updated as appropriate: allergies, current medications, past family history, past medical history, past social history, past surgical history and problem list.  Review of Systems:  Pertinent items are noted in HPI.    Objective:  Physical Exam Blood pressure (!) 162/89, pulse 89, last menstrual period 01/26/2021.  CONSTITUTIONAL: Well-developed, well-nourished female in no acute distress.  HENT:  Normocephalic, atraumatic EYES: Conjunctivae and EOM are normal. No scleral icterus.  NECK: Normal range of motion SKIN: Skin is warm and dry. No rash noted. Not diaphoretic.No pallor. [place]: Alert and oriented to person, place, and time. Normal coordination.  Abd: Soft, nontender and nondistended; Steri strips removed. Incision C/D/I Pelvic: deferred Left arm: no swelling. There is bruising of the left hand and forearm. Nontender to the touch. No cords. Good muscle tone.  Labs and Imaging DG Chest 2 View  Result Date: 02/02/2021 CLINICAL DATA:  Shortness of breath, left hand extravasation after iron infusion 2 days ago. EXAM: CHEST - 2 VIEW COMPARISON:  None. FINDINGS: The heart size and mediastinal contours are within normal limits. No focal consolidation. No pleural effusion. No pneumothorax. The visualized skeletal structures are unremarkable. IMPRESSION: No active cardiopulmonary disease. Electronically Signed   By: Dahlia Bailiff M.D.   On: 02/02/2021 18:30   CT Angio Chest PE W and/or Wo Contrast  Result Date: 02/02/2021 CLINICAL DATA:  Shortness of breath,  positive D-dimer, suspected pulmonary embolism. EXAM: CT ANGIOGRAPHY CHEST WITH CONTRAST TECHNIQUE: Multidetector CT imaging of the chest was performed using the standard protocol during bolus administration of intravenous contrast. Multiplanar CT image reconstructions and MIPs were obtained to evaluate the vascular anatomy. CONTRAST:  62mL OMNIPAQUE IOHEXOL 350 MG/ML SOLN COMPARISON:  PA and lateral chest today, abdomen and pelvis CT 08/22/2020. No prior cross-sectional chest imaging. FINDINGS: Cardiovascular: There is mild panchamber cardiomegaly without findings of acute right heart strain. No pericardial effusion is seen. There are minimal calcifications in the left main and proximal LAD coronary arteries. There is no venous dilatation. Pulmonary arteries are normal in caliber without appreciable embolic filling defects. The aorta and branch vessels are normal. No plaques or dissection are noted and no aneurysm. Mediastinum/Nodes: There are no enlarged intrathoracic or axillary lymph nodes. There is a 2.8 cm heterogeneous mass in the right lobe of the thyroid gland and 2.1 cm heterogeneous mass in the left lobe. Thoracic esophagus is unremarkable as visualized. Lungs/Pleura: There is no pleural effusion, thickening or pneumothorax. There are mild paraseptal emphysematous changes in the lung apices. The lungs are clear. Upper Abdomen: No acute abnormality. Musculoskeletal: No chest wall abnormality. No acute or significant osseous findings. Review of the MIP images confirms the above findings. IMPRESSION: 1. No acute chest CT or CTA findings. 2. Mild cardiomegaly, small amount of calcification left main and LAD coronary arteries. 3. Bilateral thyroid masses. Ultrasound routine follow-up recommended. 4. Early paraseptal emphysematous change lung apices. Electronically Signed   By: Telford Nab M.D.   On: 02/02/2021 22:04   VAS [place] UPPER EXTREMITY VENOUS DUPLEX  Result Date: 02/04/2021 UPPER VENOUS STUDY  Patient Name:  BLAKLEY MICHNA Whistler  Date of Exam:   02/04/2021 Medical Rec #: [id number]       Accession #:    [id number] Date of Birth: 20-Jun-1970        Patient Gender: F Patient Age:   52 years Exam Location:  [facility] Procedure:      VAS [place] UPPER EXTREMITY VENOUS DUPLEX Referring Phys: PRANAV PATEL --------------------------------------------------------------------------------  Indications: Edema Risk Factors: None identified. Comparison Study: No prior studies. Performing Technologist: Oliver Hum RVT  Examination Guidelines: A complete evaluation includes B-mode imaging, spectral Doppler, color Doppler, and power Doppler as needed of all accessible portions of each vessel. Bilateral testing is considered an integral part of a complete examination. Limited examinations for reoccurring indications may be performed as noted.  Right Findings: +----------+------------+---------+-----------+----------+-------+  RIGHT      Compressible Phasicity Spontaneous Properties Summary  +----------+------------+---------+-----------+----------+-------+  Subclavian     Full        Yes        Yes                         +----------+------------+---------+-----------+----------+-------+  Left Findings: +----------+------------+---------+-----------+----------+-------+  LEFT       Compressible Phasicity Spontaneous Properties Summary  +----------+------------+---------+-----------+----------+-------+  IJV            Full        Yes        Yes                         +----------+------------+---------+-----------+----------+-------+  Subclavian     Full        Yes        Yes                         +----------+------------+---------+-----------+----------+-------+  Axillary       Full        Yes        Yes                         +----------+------------+---------+-----------+----------+-------+  Brachial       Full        Yes        Yes                          +----------+------------+---------+-----------+----------+-------+  Radial         Full                                               +----------+------------+---------+-----------+----------+-------+  Ulnar          Full                                               +----------+------------+---------+-----------+----------+-------+  Cephalic       None                                       Acute   +----------+------------+---------+-----------+----------+-------+  Basilic  Full                                               +----------+------------+---------+-----------+----------+-------+ Superficial thrombus noted within the cephalic vein is located in the mid forearm only.  Summary:  Right: No evidence of thrombosis in the subclavian.  Left: No evidence of deep vein thrombosis in the upper extremity. Findings consistent with acute superficial vein thrombosis involving the left cephalic vein.  *See table(s) above for measurements and observations.  Diagnosing physician: Monica Martinez MD Electronically signed by Monica Martinez MD on 02/04/2021 at 1:49:20 PM.    Final     Assessment & Plan:   Diagnoses and all orders for this visit:  Superficial thrombophlebitis of left upper extremity  Post-operative state   Rec NSAIDS prn Gradual increase in activites.  F/u in 4 weeks or sooner prn   Chardonnay Holzmann L. Harraway-Smith, M.D., Cherlynn June

## 2021-03-13 ENCOUNTER — Encounter: Payer: Self-pay | Admitting: Obstetrics & Gynecology

## 2021-03-13 ENCOUNTER — Encounter: Payer: Self-pay | Admitting: General Practice

## 2021-03-13 ENCOUNTER — Other Ambulatory Visit: Payer: Self-pay

## 2021-03-13 ENCOUNTER — Other Ambulatory Visit (HOSPITAL_COMMUNITY)
Admission: RE | Admit: 2021-03-13 | Discharge: 2021-03-13 | Disposition: A | Discharge status: Home / Self Care | Payer: 59 | Source: Ambulatory Visit | Attending: Obstetrics & Gynecology | Admitting: Obstetrics & Gynecology

## 2021-03-13 ENCOUNTER — Ambulatory Visit (INDEPENDENT_AMBULATORY_CARE_PROVIDER_SITE_OTHER): Payer: 59 | Admitting: Obstetrics & Gynecology

## 2021-03-13 VITALS — BP 178/85 | HR 78 | Wt 172.0 lb

## 2021-03-13 DIAGNOSIS — Z90711 Acquired absence of uterus with remaining cervical stump: Secondary | ICD-10-CM

## 2021-03-13 DIAGNOSIS — Z09 Encounter for follow-up examination after completed treatment for conditions other than malignant neoplasm: Secondary | ICD-10-CM

## 2021-03-13 DIAGNOSIS — N898 Other specified noninflammatory disorders of vagina: Secondary | ICD-10-CM

## 2021-03-13 MED ORDER — IBUPROFEN 800 MG PO TABS
800.0000 mg | ORAL_TABLET | Freq: Three times a day (TID) | ORAL | 2 refills | Status: AC | PRN
Start: 2021-03-13 — End: ?

## 2021-03-13 NOTE — Progress Notes (Addendum)
° °  Subjective:     Peggy Payne is a 51 y.o. female who presents to the office 6 weeks status post supracervical hysterectomy, bilateral salpingectomy, left oophorectomy for fibroids. Complicated by postoperatie anemia needing transfusion of one unit of pRBCs, also by superficial left hand thrombophlebitis.  Reports mild incisional pain, wants refill of Ibuprofen.  Eating a regular diet without difficulty. Bowel movements are normal.   The following portions of the patient's history were reviewed and updated as appropriate: allergies, current medications, past family history, past medical history, past social history, past surgical history, and problem list. Normal pap and negative HPV on 09/20/2020. Normal mammogram 09/21/2020.  She will schedule colonoscopy soon.  Review of Systems Pertinent items noted in HPI and remainder of comprehensive ROS otherwise negative.    Objective:    BP (!) 178/85    Pulse 78    Wt 172 lb (78 kg)    LMP 01/26/2021 (Exact Date)    BMI 27.76 kg/m  General:  alert and no distress  Abdomen: soft, bowel sounds active, non-tender  Incision:   healing well, no drainage, no erythema, no hernia, no seroma, no swelling, no dehiscence, incision well approximated  Pelvic: Normal external female genitalia. Copious white-yellow discharge in vagina, testing sample obtained. Normal cervix, no tenderness. Done in presence of chaperone     Assessment:    Doing well postoperatively. Pathology findings already reviewed with patient    Plan:    1. Ibuprofen refilled. 2. Wound care discussed. 3. Will follow up vaginal discharge testing and manage accordingly. 4. Activity restrictions: none 5. Anticipated return to work: now and work Quarry manager provided. 6. Follow up as needed.    Verita Schneiders, MD, [place] [facility] [employer], [place]

## 2021-03-13 NOTE — Addendum Note (Signed)
Addended by: Verita Schneiders A on: 03/13/2021 04:09 PM   Modules accepted: Orders

## 2021-03-15 LAB — CERVICOVAGINAL ANCILLARY ONLY
Bacterial Vaginitis (gardnerella): NEGATIVE
Candida Glabrata: NEGATIVE
Candida Vaginitis: NEGATIVE
Comment: NEGATIVE
Comment: NEGATIVE
Comment: NEGATIVE

## 2021-05-29 ENCOUNTER — Telehealth: Payer: Self-pay

## 2021-05-29 NOTE — Telephone Encounter (Signed)
? ?  Hypertension Outreach Note  ?Pharmacy Note ? ? ?Left message for patient to return call ? ?Attempted to reach out to patient to discuss HTN control since starting amlodipine ? ?Tomasa Blase, PharmD ?Clinical Pharmacist, [place]  ?

## 2022-07-26 IMAGING — US US PELVIS COMPLETE
1 series · 13 of 25 positions shown · non-contrast
Comparison: None.

CLINICAL DATA: Abdominal pain and distension.

EXAM:
TRANSABDOMINAL ULTRASOUND OF PELVIS
TECHNIQUE: Transabdominal ultrasound examination of the pelvis was performed
including evaluation of the uterus, ovaries, adnexal regions, and
pelvic cul-de-sac. Patient declined transvaginal exam.

[Series 1: us pelvis complete · 13 of 96 slices shown]
[im 1/96]
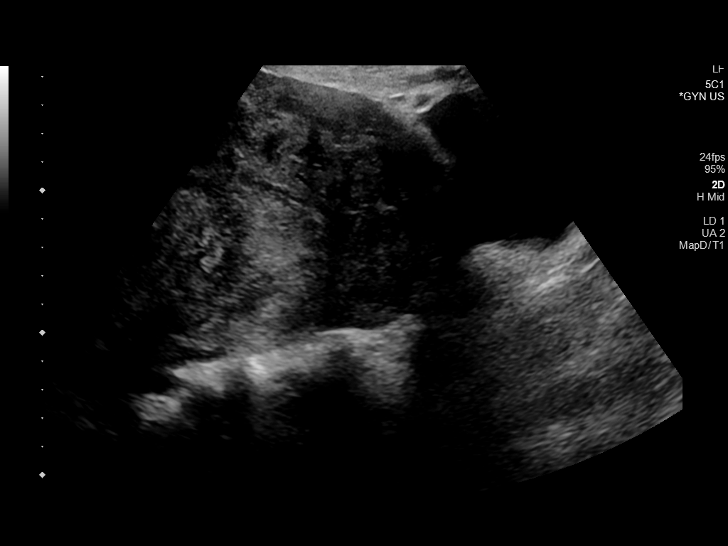
[im 8/96]
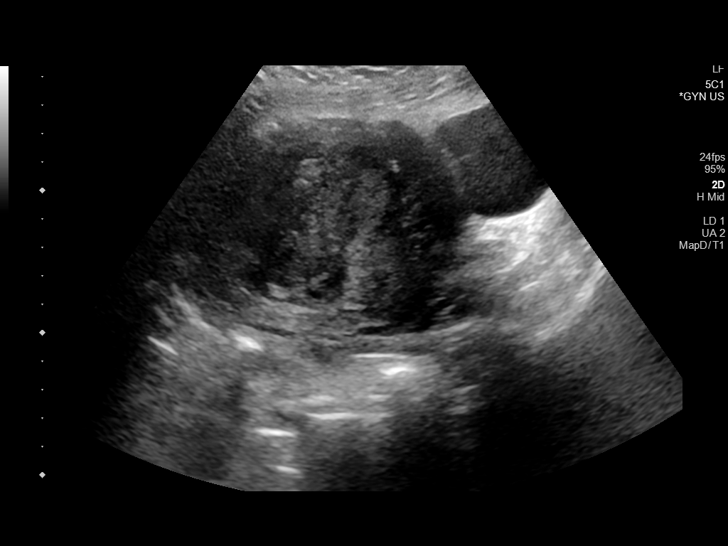
[im 16/96]
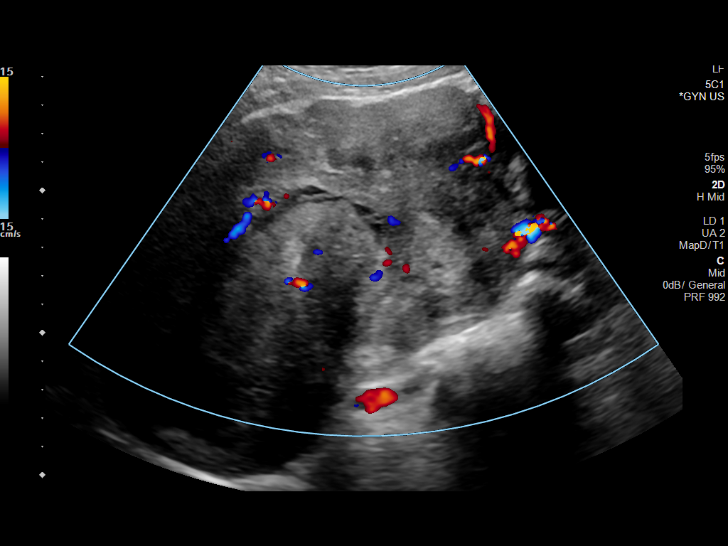
[im 24/96]
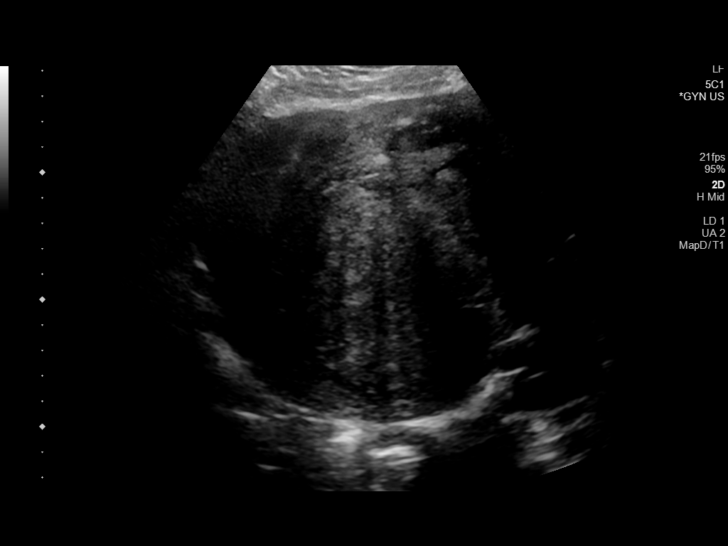
[im 32/96]
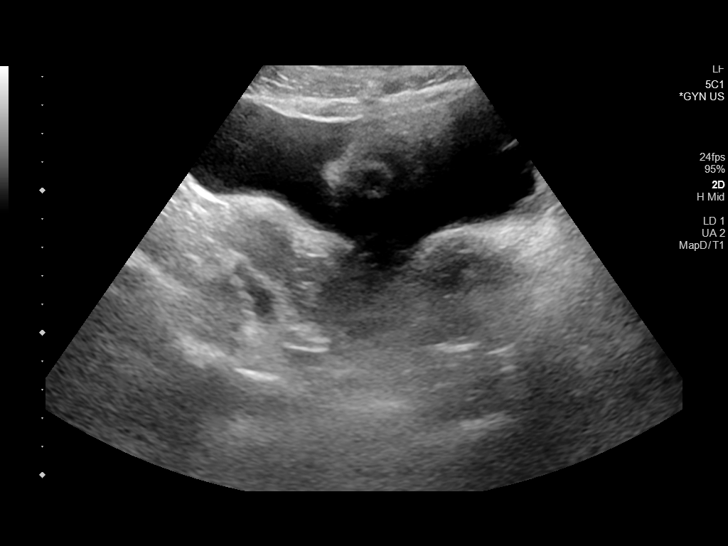
[im 40/96]
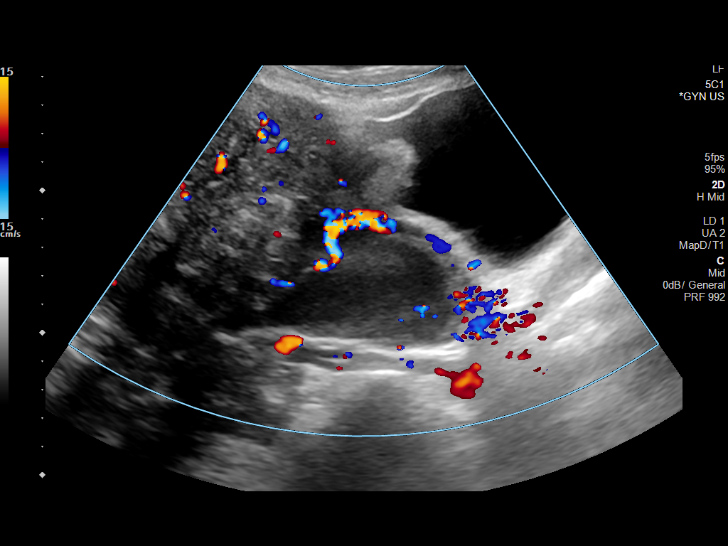
[im 48/96]
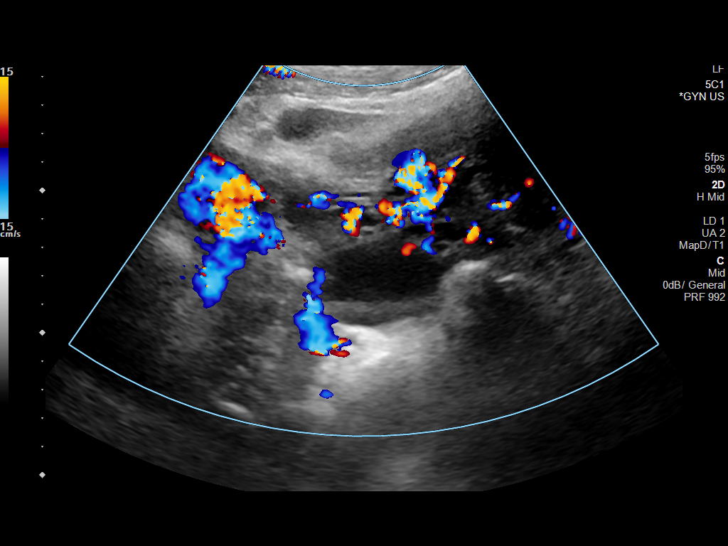
[im 56/96]
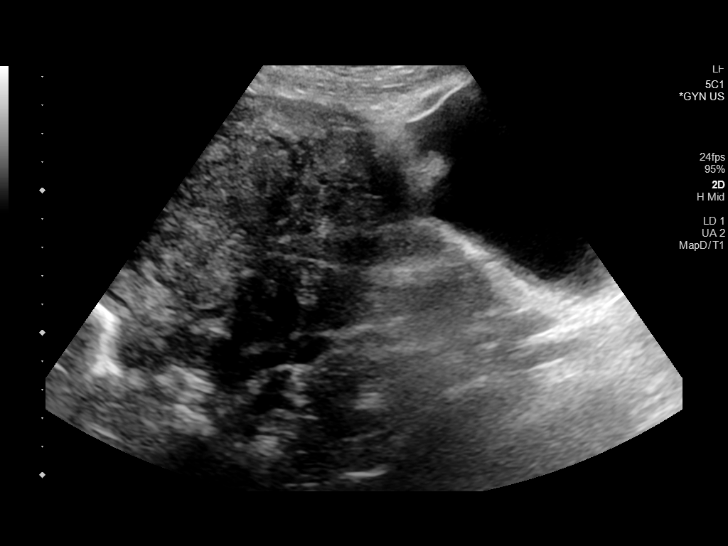
[im 64/96]
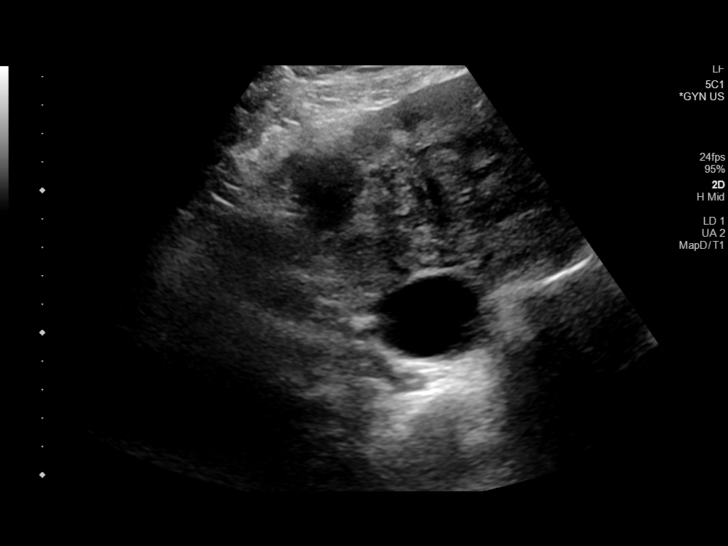
[im 72/96]
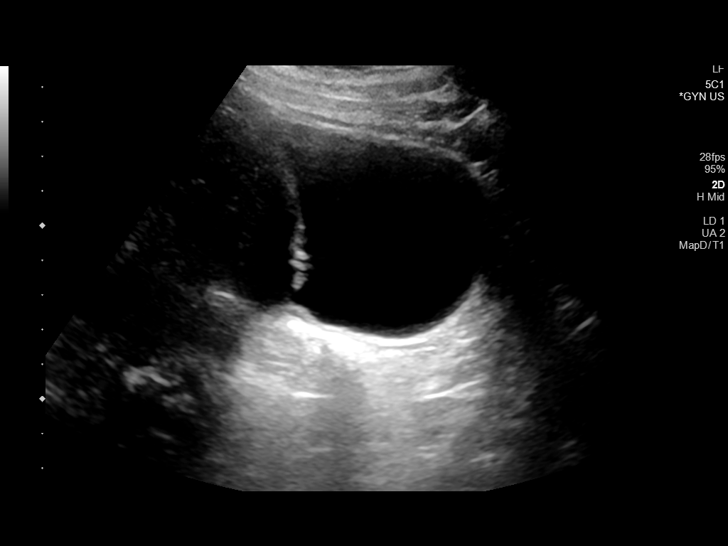
[im 80/96]
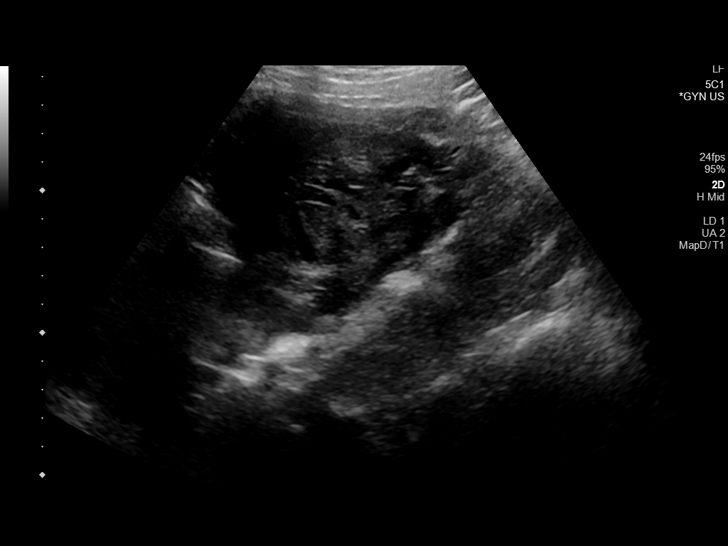
[im 88/96]
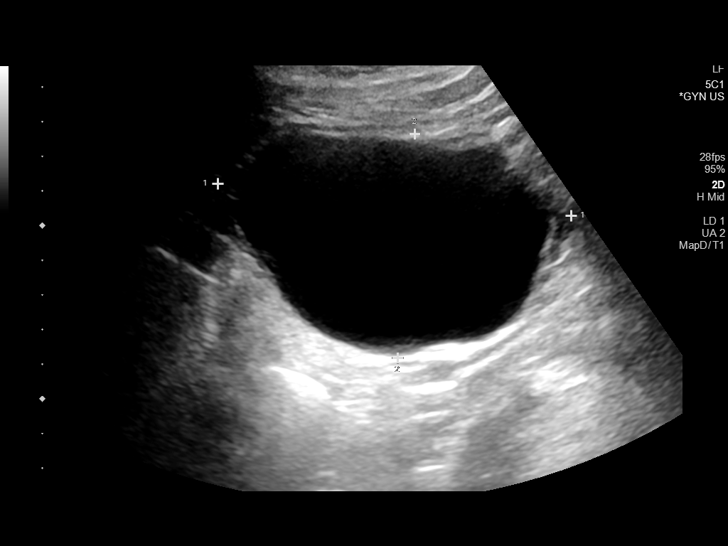
[im 96/96]
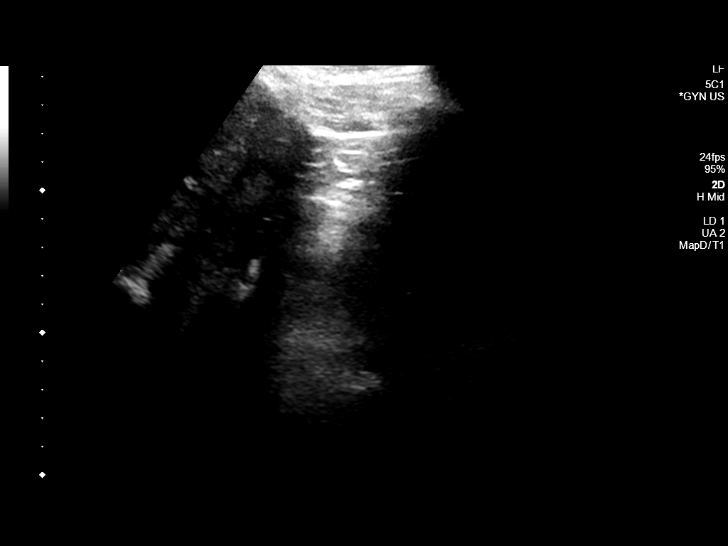

[13 of 25 positions shown; findings below may reference images not displayed]

FINDINGS: Uterus

Measurements: 19.0 x 12.4 x 16.9 cm = volume: 2,086 mL. The uterus
is enlarged, heterogeneous, with innumerable fibroids. The largest
fibroid is seen in the fundus at the fundus measuring 11.2 x 7.8 x
8.8 cm. Technologist states the uterus extends beyond the umbilicus.

Endometrium

Obscured by fibroids, where visualized is normal measuring 3 mm.

Right ovary

Measurements: 7.4 x 4.3 x 6.6 cm = volume: 109 mL. There are 2 cysts
in the ovary. Larger cyst measuring 6.4 x 3.4 x 4.9 cm, with
question of slight thickening peripherally. Smaller cyst measures
3.4 x 2.7 x 2.6 cm. Blood flow is noted to the ovarian parenchyma.

Left ovary

Measurements: Majority of the ovary is replaced by a large cyst.
Cyst measures 8.4 x 5.5 x 6.3 cm and has a peripheral echogenic
focus with shadowing, possible calcifications. There is blood flow
to the ovarian parenchyma peripherally.

Other findings:  No abnormal free fluid.
IMPRESSION: 1. Enlarged heterogeneous uterus with multiple uterine fibroids,
largest fibroid at the fundus measures 11 cm.
2. Bilateral ovarian cysts. 8.4 cm cyst in the left ovary has a
peripheral echogenic focus with shadowing, possible calcification.
This may represent an a dermoid. A 6.4 cm cyst in the right ovary
has questionable thickening peripherally. These do not meet criteria
for simple cysts. Recommend gyn consultation and consideration for
MRI characterization.

## 2023-01-06 IMAGING — CR DG CHEST 2V
2 series · 2 of 2 positions shown · non-contrast
Comparison: None.

CLINICAL DATA: Shortness of breath, left hand extravasation after
iron infusion 2 days ago.

EXAM:
CHEST - 2 VIEW

[w chest lat]
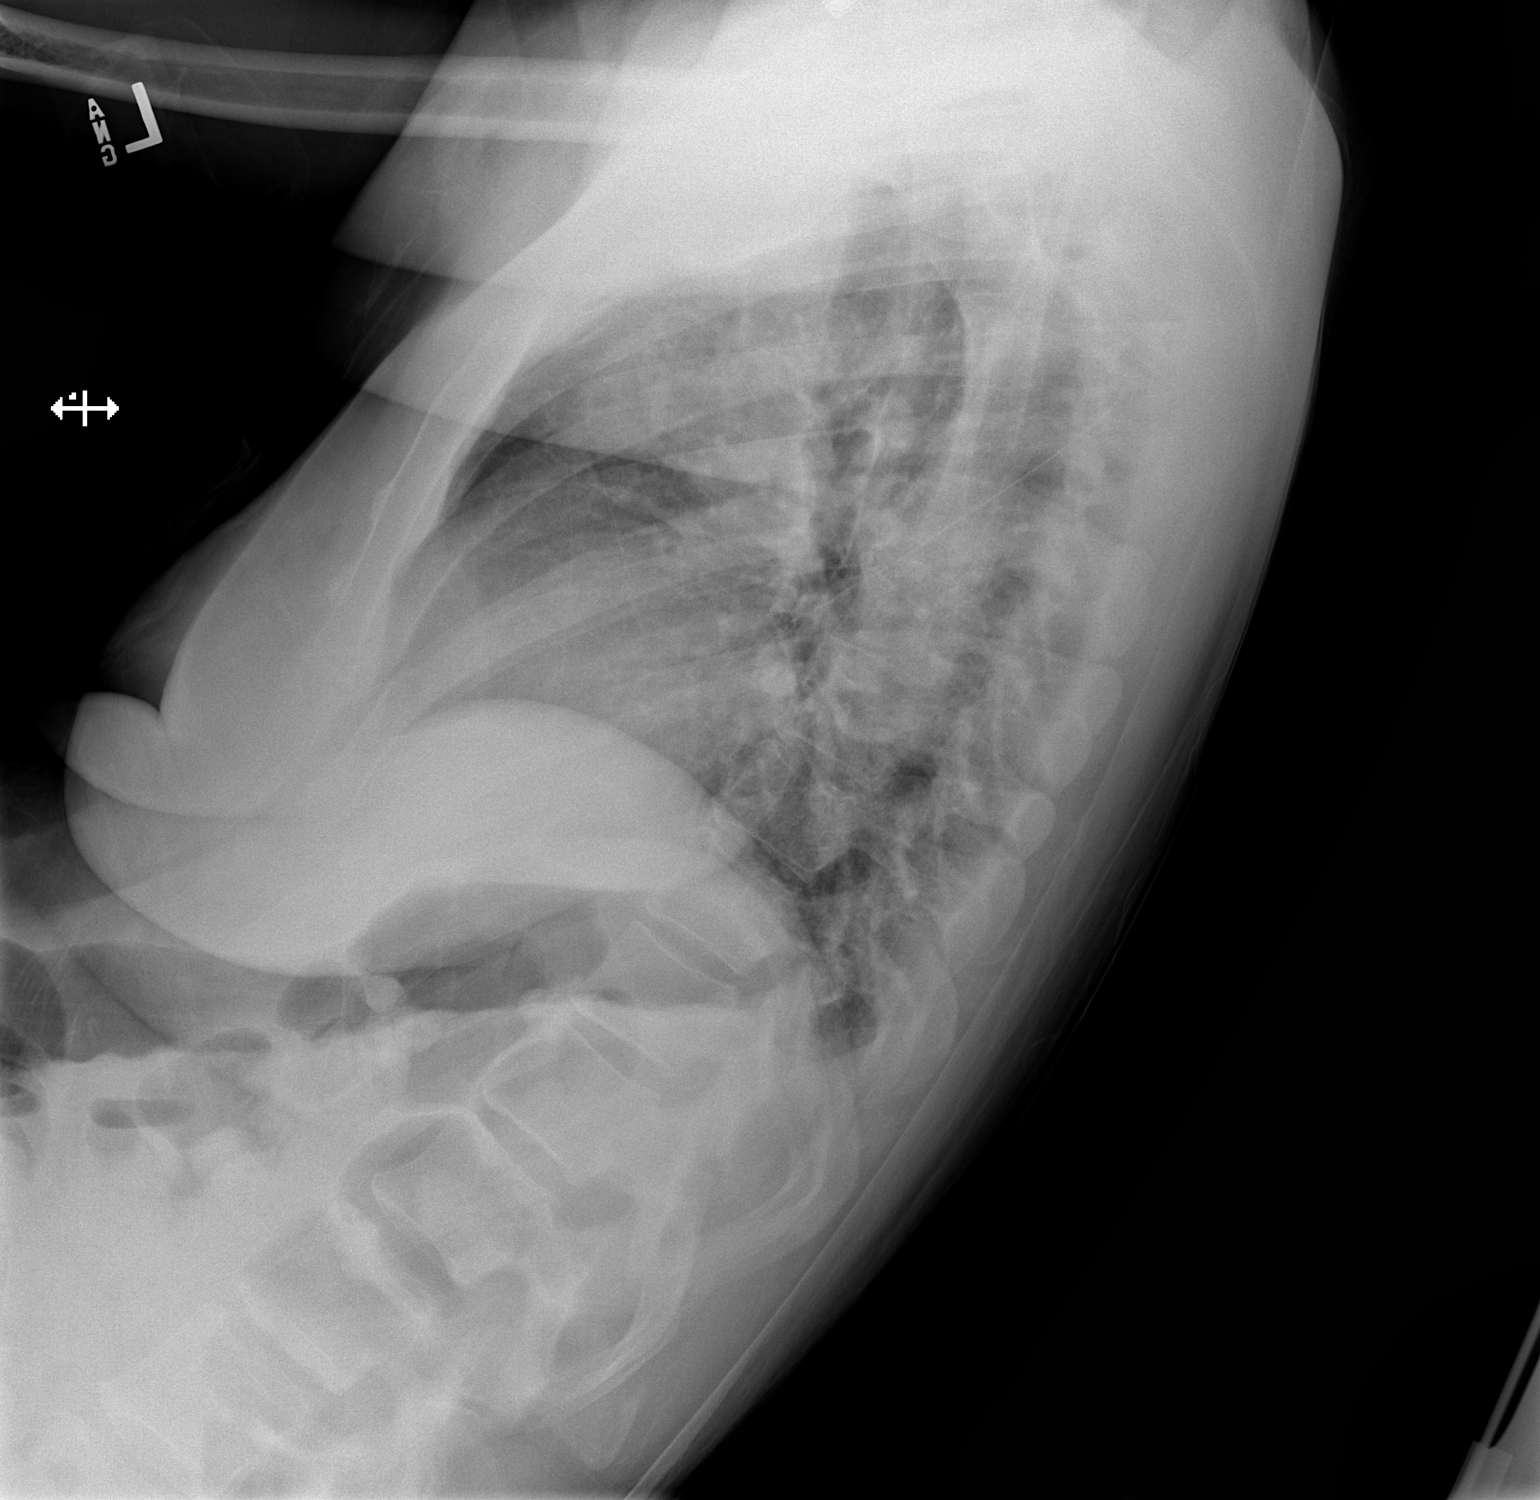

[x chest ap]
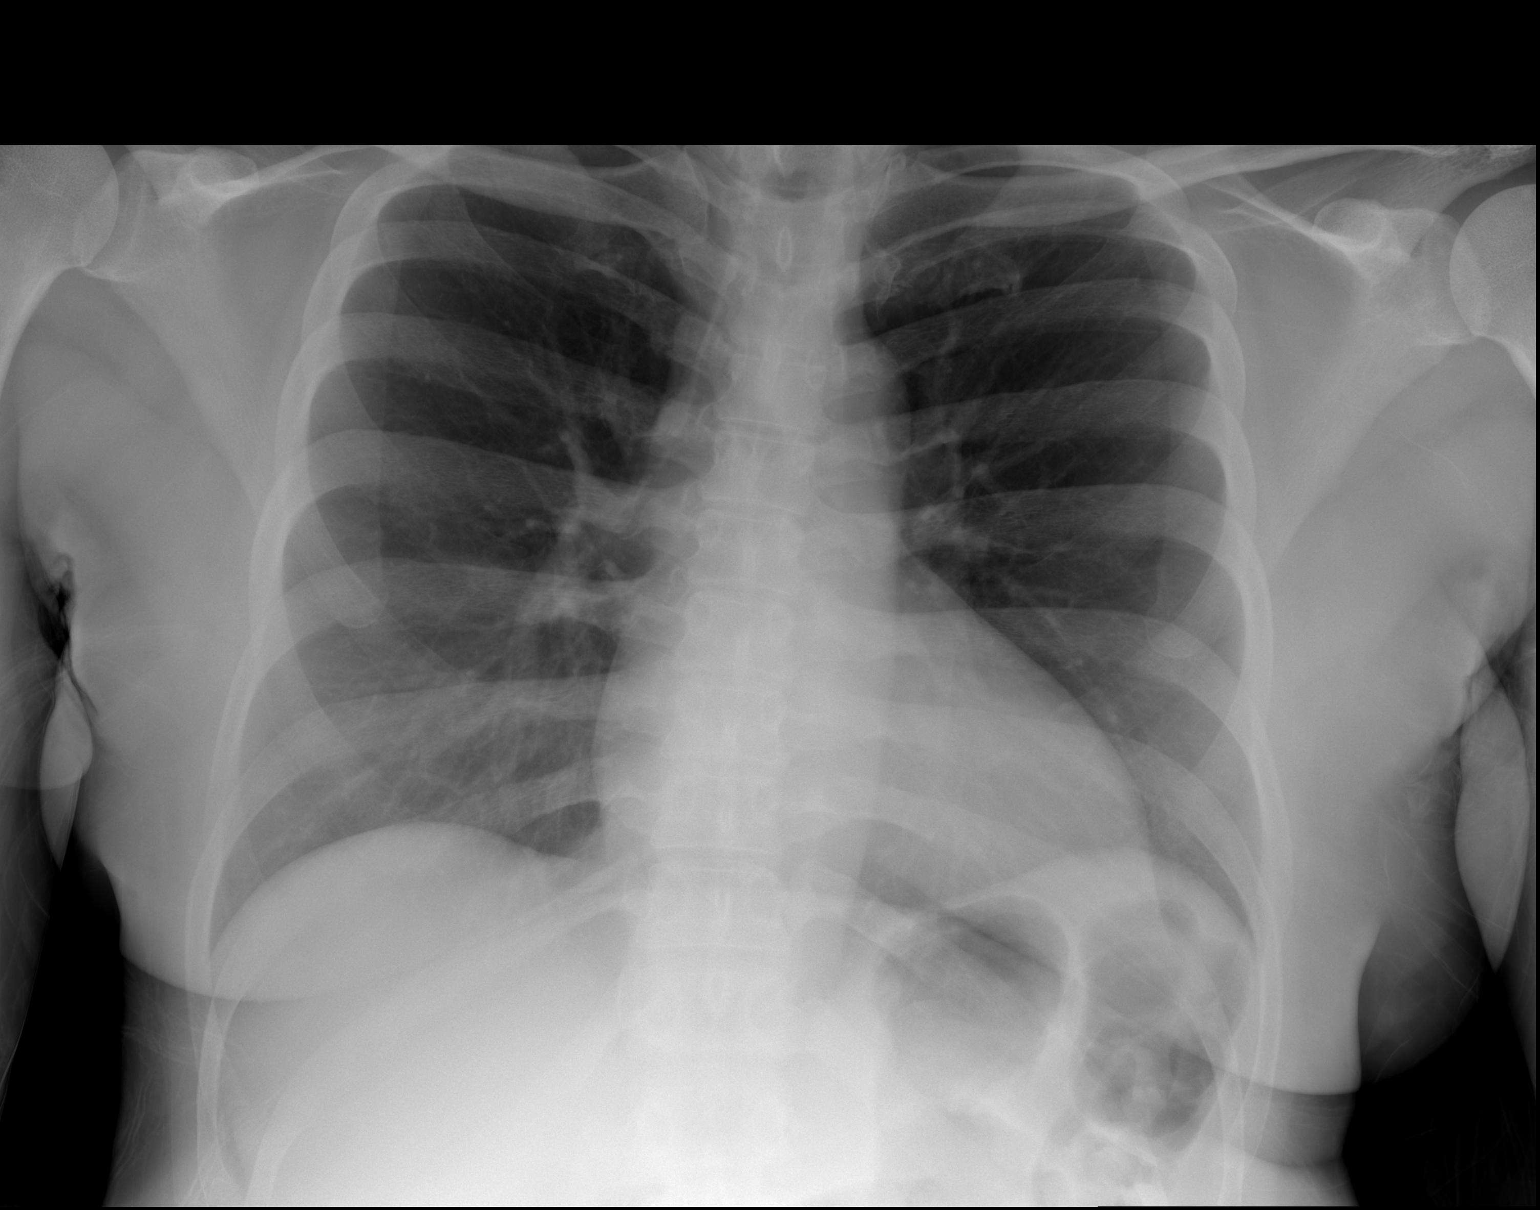

[2 of 2 positions shown; findings below may reference images not displayed]

FINDINGS: The heart size and mediastinal contours are within normal limits. No
focal consolidation. No pleural effusion. No pneumothorax. The
visualized skeletal structures are unremarkable.
IMPRESSION: No active cardiopulmonary disease.

## 2023-01-06 IMAGING — CT CT ANGIO CHEST
2 of 6 series · 18 of 46 positions shown · IV contrast (omnipaque)
Comparison: PA and lateral chest today, abdomen and pelvis CT
08/22/2020. No prior cross-sectional chest imaging.

CLINICAL DATA: Shortness of breath, positive D-dimer, suspected
pulmonary embolism.

EXAM:
CT ANGIOGRAPHY CHEST WITH CONTRAST
TECHNIQUE: Multidetector CT imaging of the chest was performed using the
standard protocol during bolus administration of intravenous
contrast. Multiplanar CT image reconstructions and MIPs were
obtained to evaluate the vascular anatomy.
CONTRAST:  75mL OMNIPAQUE IOHEXOL 350 MG/ML SOLN

[Series 5: thins · axial · 0.81mm/px · z∈[-423,-151]mm · 15 of 304 slices shown]
[im 16/304  lung]
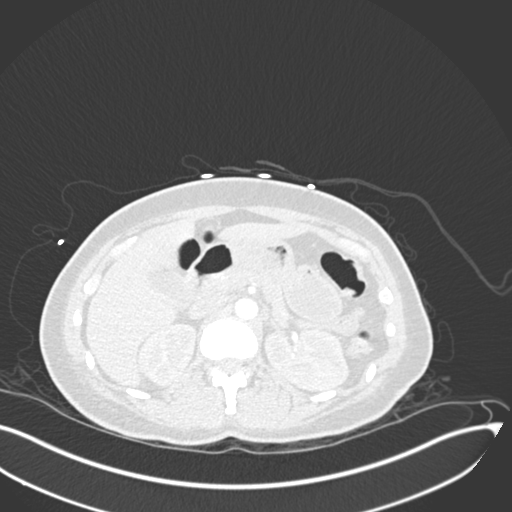
[im 31/304  soft-tissue]
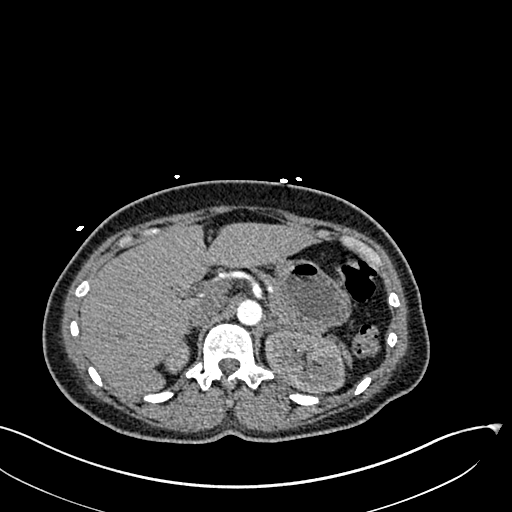
[im 61/304  lung]
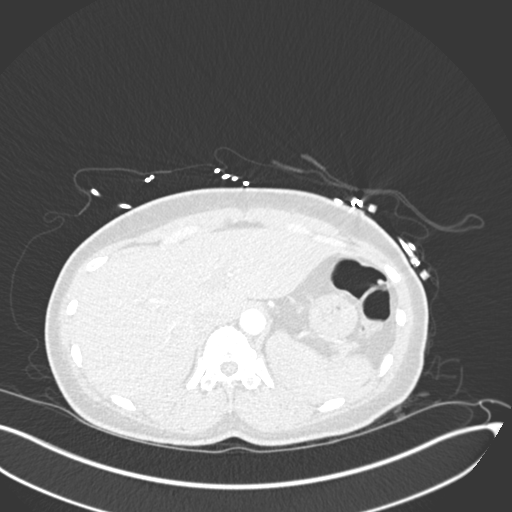
[im 76/304  soft-tissue]
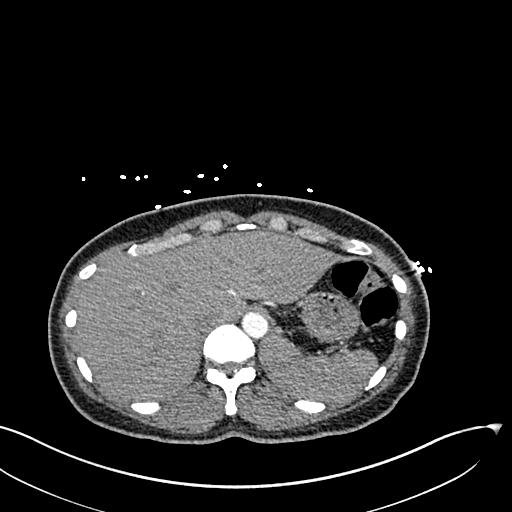
[im 91/304  lung]
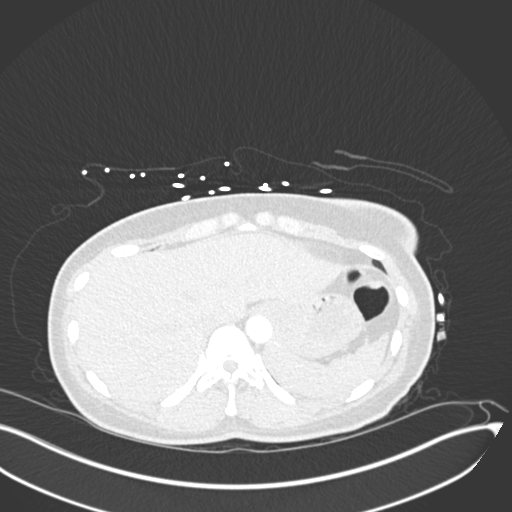
[im 107/304  soft-tissue]
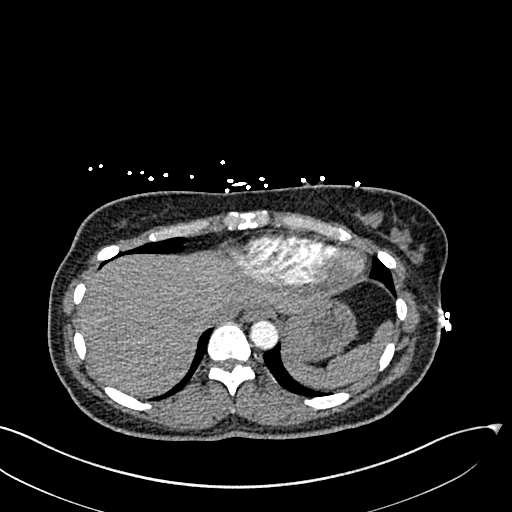
[im 137/304  lung]
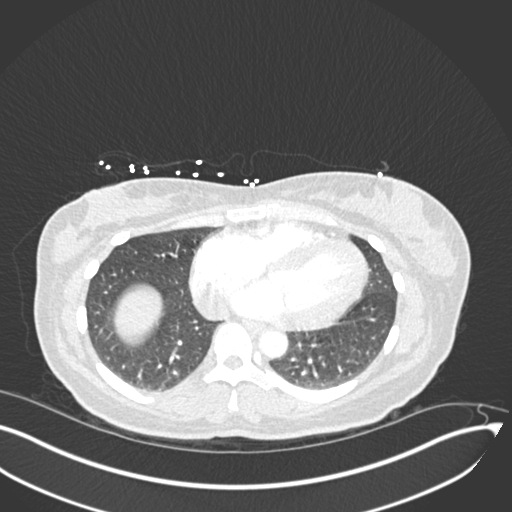
[im 152/304  soft-tissue]
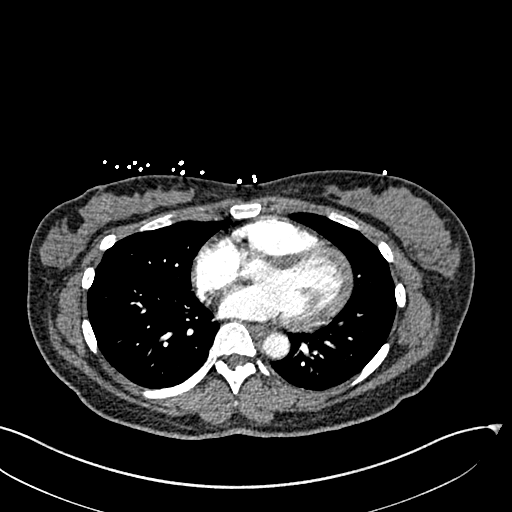
[im 167/304  lung]
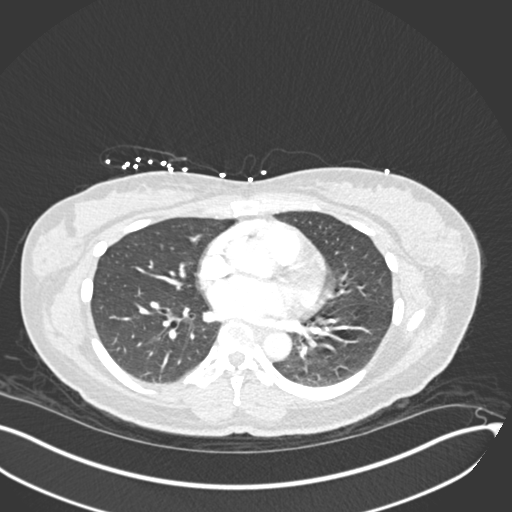
[im 197/304  soft-tissue]
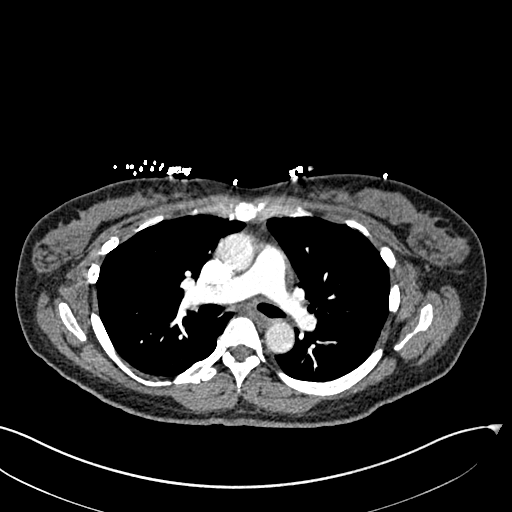
[im 213/304  lung]
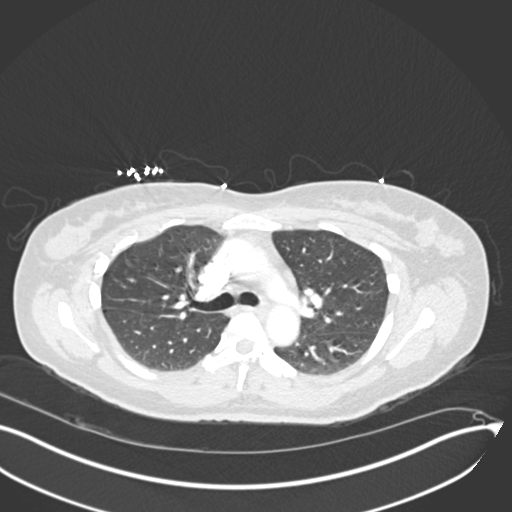
[im 228/304  soft-tissue]
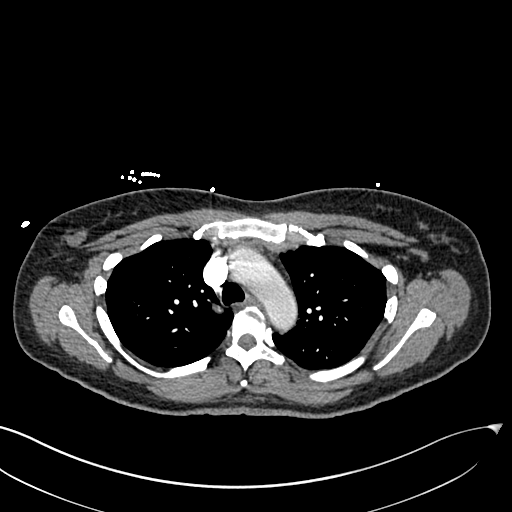
[im 243/304  lung]
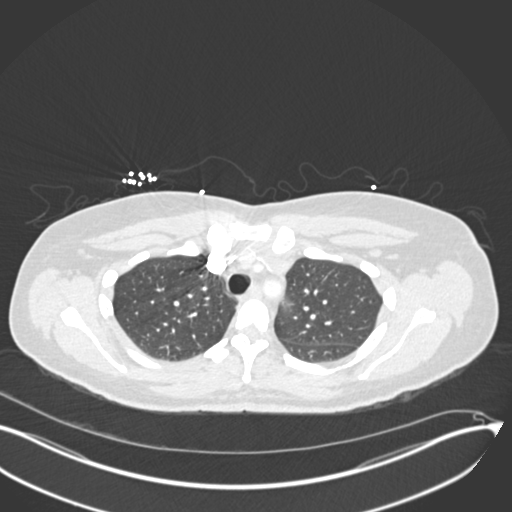
[im 273/304  soft-tissue]
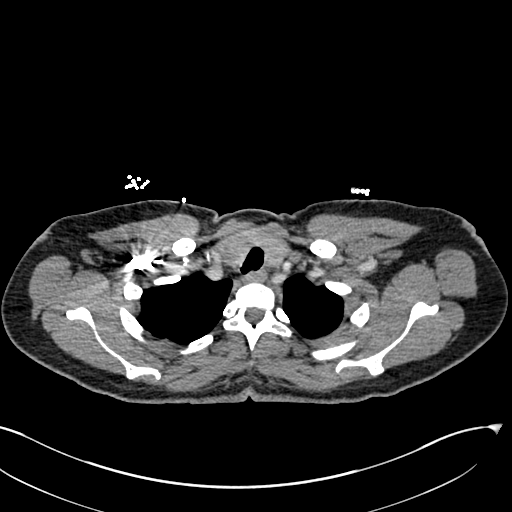
[im 288/304  lung]
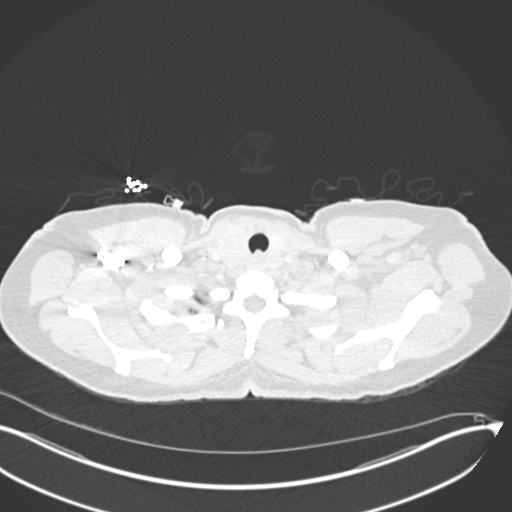

[Series 7: coronal mpr · coronal · 0.61mm/px · 3 of 121 slices shown]
[im 31/121  soft-tissue]
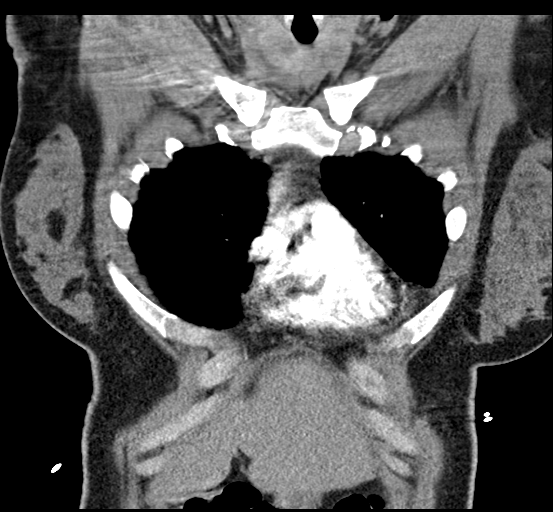
[im 61/121  soft-tissue]
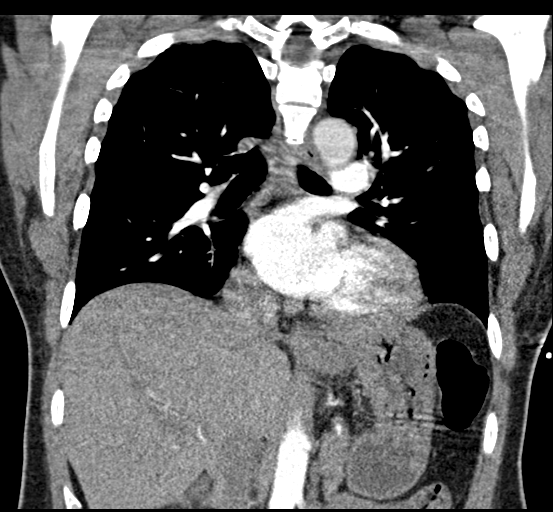
[im 91/121  soft-tissue]
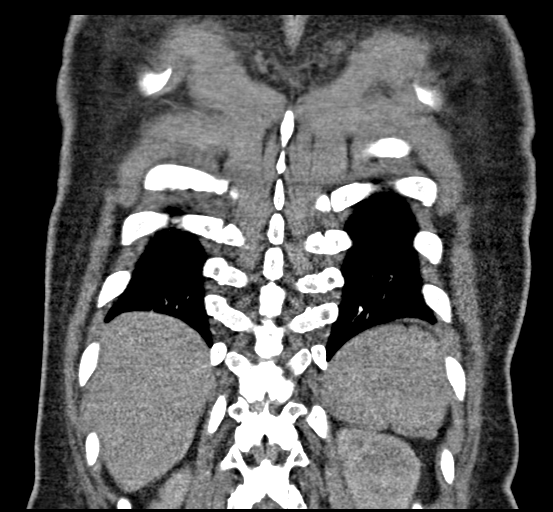

[18 of 46 positions shown; findings below may reference images not displayed]

FINDINGS: Cardiovascular: There is mild panchamber cardiomegaly without
findings of acute right heart strain. No pericardial effusion is
seen. There are minimal calcifications in the left main and proximal
LAD coronary arteries.

There is no venous dilatation. Pulmonary arteries are normal in
caliber without appreciable embolic filling defects. The aorta and
branch vessels are normal. No plaques or dissection are noted and no
aneurysm.

Mediastinum/Nodes: There are no enlarged intrathoracic or axillary
lymph nodes. There is a 2.8 cm heterogeneous mass in the right lobe
of the thyroid gland and 2.1 cm heterogeneous mass in the left lobe.
Thoracic esophagus is unremarkable as visualized.

Lungs/Pleura: There is no pleural effusion, thickening or
pneumothorax. There are mild paraseptal emphysematous changes in the
lung apices. The lungs are clear.

Upper Abdomen: No acute abnormality.

Musculoskeletal: No chest wall abnormality. No acute or significant
osseous findings.

Review of the MIP images confirms the above findings.
IMPRESSION: 1. No acute chest CT or CTA findings.
2. Mild cardiomegaly, small amount of calcification left main and
LAD coronary arteries.
3. Bilateral thyroid masses. Ultrasound routine follow-up
recommended.
4. Early paraseptal emphysematous change lung apices.

## 2024-03-29 ENCOUNTER — Encounter: Admitting: Emergency Medicine

## 2024-04-07 ENCOUNTER — Encounter: Admitting: Emergency Medicine

## 2024-04-12 ENCOUNTER — Encounter: Admitting: Emergency Medicine
